# Patient Record
Sex: Female | Born: 2000 | Race: Black or African American | Hispanic: No | Marital: Single | Smoking: Never smoker
Health system: Southern US, Community
[De-identification: ages and names within clinical notes are randomized; demographics above are authoritative.]

## PROBLEM LIST (undated history)

## (undated) DIAGNOSIS — N920 Excessive and frequent menstruation with regular cycle: Secondary | ICD-10-CM

## (undated) DIAGNOSIS — D500 Iron deficiency anemia secondary to blood loss (chronic): Secondary | ICD-10-CM

## (undated) DIAGNOSIS — T7840XA Allergy, unspecified, initial encounter: Secondary | ICD-10-CM

## (undated) HISTORY — PX: WISDOM TOOTH EXTRACTION: SHX21

## (undated) HISTORY — DX: Allergy, unspecified, initial encounter: T78.40XA

---
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
## 2022-12-14 DIAGNOSIS — Z8619 Personal history of other infectious and parasitic diseases: Secondary | ICD-10-CM

## 2022-12-14 HISTORY — DX: Personal history of other infectious and parasitic diseases: Z86.19

## 2023-01-04 NOTE — Patient Instructions (Signed)
Welcome to [facility], It was a pleasure meeting you today!    I will review your lab results via MyChart in a few days.  Ok to take an over the counter iron supplement during your menstrual cycles to keep your iron level up. But if you are really low today, I may prescribed a RX strength.  See the attached info regarding birth control options like we talked about and if you decide you want to start something, schedule a visit or if IUD or Nexplanon I will need to refer you.     PLEASE NOTE: If you had any LAB tests please let us know if you have not heard back within a few days. You may see your results on MyChart before we have a chance to review them but we will give you a call once they are reviewed by [place]. If we ordered any REFERRALS today, please let us know if you have not heard from their office within the next week.  Let us know through MyChart if you are needing REFILLS, or have your pharmacy send [place] the request. You can also use MyChart to communicate with me or any office staff.  Please try these tips to maintain a healthy lifestyle: It is important that you exercise regularly at least 30 minutes 5 times a week. Think about what you will eat, plan ahead. Choose whole foods, & think  "clean, green, fresh or frozen" over canned, processed or packaged foods which are more sugary, salty, and fatty. 70 to 75% of food eaten should be fresh vegetables and protein. 2-3  meals daily with healthy snacks between meals, but must be whole fruit, protein or vegetables. Aim to eat over a 10 hour period when you are active, for example, 7am to 5pm, and then STOP after your last meal of the day, drinking only water.  Shorter eating windows, 6-8 hours, are showing benefits in heart disease and blood sugar regulation. Drink water every day! Shoot for 64 ounces daily = 8 cups, no other drink is as healthy! Fruit juice is best enjoyed in a healthy way, by EATING the  fruit.

## 2023-01-04 NOTE — Progress Notes (Unsigned)
Phone [phone]  Subjective:   Patient is a 22 y.o. female presenting for annual physical.    No chief complaint on file.   See problem oriented charting- ROS- full  review of systems was completed and negative except for *** noted in HPI above.  The following were reviewed and entered/updated in epic: No past medical history on file. There are no problems to display for this patient.  No past surgical history on file.  No family history on file.  Medications- reviewed and updated Current Outpatient Medications  Medication Sig Dispense Refill   fluticasone (FLONASE) 50 MCG/ACT nasal spray Place 1 spray into both nostrils daily. 9.9 g 0   methocarbamol (ROBAXIN) 500 MG tablet Take 1 tablet (500 mg total) by mouth every 6 (six) hours as needed for muscle spasms. 20 tablet 0   naproxen (NAPROSYN) 500 MG tablet Take 1 tablet (500 mg total) by mouth 2 (two) times daily. 20 tablet 0   pseudoephedrine (SUDAFED 12 HOUR) 120 MG 12 hr tablet Take 1 tablet (120 mg total) by mouth every 12 (twelve) hours as needed for congestion. 10 tablet 0   No current facility-administered medications for this visit.    Allergies-reviewed and updated No Known Allergies  Social History   Social History Narrative   Not on file    Objective:  There were no vitals taken for this visit. Physical Exam Vitals and nursing note reviewed.  Constitutional:      Appearance: Normal appearance.  HENT:     Head: Normocephalic.     Right Ear: Tympanic membrane normal.     Left Ear: Tympanic membrane normal.     Nose: Nose normal.     Mouth/Throat:     Mouth: Mucous membranes are moist.  Eyes:     Pupils: Pupils are equal, round, and reactive to light.  Cardiovascular:     Rate and Rhythm: Normal rate and regular rhythm.  Pulmonary:     Effort: Pulmonary effort is normal.     Breath sounds: Normal breath sounds.  Musculoskeletal:        General: Normal range of motion.     Cervical back:  Normal range of motion.  Lymphadenopathy:     Cervical: No cervical adenopathy.  Skin:    General: Skin is warm and dry.  Neurological:     Mental Status: She is alert.  Psychiatric:        Mood and Affect: Mood normal.        Behavior: Behavior normal.       Assessment and Plan   Health Maintenance counseling: 1. Anticipatory guidance: Patient counseled regarding regular dental exams q6 months, eye exams,  avoiding smoking and second hand smoke, limiting alcohol to 1 beverage per day, no illicit drugs.   2. Risk factor reduction:  Advised patient of need for regular exercise and diet rich with fruits and vegetables to reduce risk of heart attack and stroke. Wt Readings from Last 3 Encounters:  08/19/19 117 lb (53.1 kg) (31%, Z= -0.50)*  03/29/17 121 lb 0.5 oz (54.9 kg) (51%, Z= 0.03)*   * Growth percentiles are based on CDC (Girls, 2-20 Years) data.   3. Immunizations/screenings/ancillary studies  There is no immunization history on file for this patient. Health Maintenance Due  Topic Date Due   HPV VACCINES (1 - 3-dose series) Never done   HIV Screening  Never done   Hepatitis C Screening  Never done   DTaP/Tdap/Td (1 - Tdap) Never done  Cervical Cancer Screening (Pap smear)  Never done   INFLUENZA VACCINE  Never done   COVID-19 Vaccine (1 - 2023-24 season) Never done    4. Cervical cancer screening: *** 5. Skin cancer screening- advised regular sunscreen use. Denies worrisome, changing, or new skin lesions.  6. Birth control/STD check: *** 7. Smoking associated screening: *** smoker 8. Alcohol screening: ***  There are no diagnoses linked to this encounter.  Recommended follow up: ***No follow-ups on file. Future Appointments  Date Time Provider Department Center  01/05/2023  2:00 PM Dulce Sellar, NP [facility]    Lab/Order associations:fasting    Dulce Sellar, NP

## 2023-01-05 ENCOUNTER — Other Ambulatory Visit (HOSPITAL_COMMUNITY)
Admission: RE | Admit: 2023-01-05 | Discharge: 2023-01-05 | Disposition: A | Discharge status: Home / Self Care | Payer: Medicaid Other | Source: Ambulatory Visit | Attending: Family | Admitting: Family

## 2023-01-05 ENCOUNTER — Encounter: Payer: Self-pay | Admitting: Family

## 2023-01-05 ENCOUNTER — Ambulatory Visit (INDEPENDENT_AMBULATORY_CARE_PROVIDER_SITE_OTHER): Payer: Medicaid Other | Admitting: Family

## 2023-01-05 VITALS — BP 104/60 | HR 67 | Temp 97.8°F | Resp 16 | Ht 61.0 in | Wt 126.2 lb

## 2023-01-05 DIAGNOSIS — N920 Excessive and frequent menstruation with regular cycle: Secondary | ICD-10-CM | POA: Diagnosis not present

## 2023-01-05 DIAGNOSIS — A749 Chlamydial infection, unspecified: Secondary | ICD-10-CM | POA: Diagnosis not present

## 2023-01-05 DIAGNOSIS — Z113 Encounter for screening for infections with a predominantly sexual mode of transmission: Secondary | ICD-10-CM

## 2023-01-05 DIAGNOSIS — Z1159 Encounter for screening for other viral diseases: Secondary | ICD-10-CM

## 2023-01-05 DIAGNOSIS — D500 Iron deficiency anemia secondary to blood loss (chronic): Secondary | ICD-10-CM

## 2023-01-05 NOTE — Progress Notes (Signed)
New Patient Office Visit  Subjective:  Patient ID: Kayla Haley, female    DOB: September 12, 2000  Age: 22 y.o. MRN: [id number]  CC:  Chief Complaint  Patient presents with   Establish Care    Initial visit to establish care with new pcp     HPI Kayla Haley presents for establishing care today.  *Discussed the use of AI scribe software for clinical note transcription with the patient, who gave verbal consent to proceed.  Kayla Haley, a recent college graduate, presents to establish care. 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 She has applied for a [employer] program in psychology with the goal of becoming a therapist. She is currently living with family and is in a relationship with a female partner. She is sexually active and uses condoms for birth control and STD prevention. She has never been on hormonal birth control and keeps track of her menstrual cycle. She has had a past positive test for chlamydia, which was treated and resolved. Her menstrual cycles are regular but are associated with heavy bleeding and significant pain. She also reports a habit of eating ice frequently, but no increased fatigue.      Assessment & Plan:     STD testing  - Sexually active with a female partner. Not currently using hormonal birth control, relying on condoms and cycle tracking. Previous history of chlamydia. Discussed the importance of consistent condom use and the benefits of various forms of hormonal birth control. -Perform STD testing today. -Discussed potential benefits of hormonal birth control, including for heavy menstrual cycles.  Menstrual Cycle - Reports heavy and painful periods. Possible anemia due to heavy menstrual bleeding, suggested by reported ice craving. -Check CBC w/diff today. -Discussed potential benefits of hormonal birth control for heavy & painful menstrual cycles. Handouts also provided reinforcing teaching. Pt to let me know if interested in starting  something. -Recommended over-the-counter iron supplementation during menstrual cycle.  General Health Maintenance -Plan for a full physical at a future visit to check cholesterol and other health parameters. -Follow-up on STD and anemia testing results via MyChart by the end of the week.      Subjective:    Outpatient Medications Prior to Visit  Medication Sig Dispense Refill   fluticasone (FLONASE) 50 MCG/ACT nasal spray Place 1 spray into both nostrils daily. (Patient not taking: Reported on 01/05/2023) 9.9 g 0   methocarbamol (ROBAXIN) 500 MG tablet Take 1 tablet (500 mg total) by mouth every 6 (six) hours as needed for muscle spasms. (Patient not taking: Reported on 01/05/2023) 20 tablet 0   naproxen (NAPROSYN) 500 MG tablet Take 1 tablet (500 mg total) by mouth 2 (two) times daily. (Patient not taking: Reported on 01/05/2023) 20 tablet 0   pseudoephedrine (SUDAFED 12 HOUR) 120 MG 12 hr tablet Take 1 tablet (120 mg total) by mouth every 12 (twelve) hours as needed for congestion. (Patient not taking: Reported on 01/05/2023) 10 tablet 0   No facility-administered medications prior to visit.   Past Medical History:  Diagnosis Date   Allergy    Past Surgical History:  Procedure Laterality Date   WISDOM TOOTH EXTRACTION Bilateral     Objective:   Today's Vitals: BP 104/60   Pulse 67   Temp 97.8 F (36.6 C) (Temporal)   Resp 16   Ht 5\' 1"  (1.549 m)   Wt 126 lb 4 oz (57.3 kg)   LMP 01/05/2023 (Exact Date)   SpO2 99%   BMI 23.85 kg/m  Physical Exam Vitals and nursing note reviewed.  Constitutional:      Appearance: Normal appearance.  Cardiovascular:     Rate and Rhythm: Normal rate and regular rhythm.  Pulmonary:     Effort: Pulmonary effort is normal.     Breath sounds: Normal breath sounds.  Musculoskeletal:        General: Normal range of motion.  Skin:    General: Skin is warm and dry.  Neurological:     Mental Status: She is alert.  Psychiatric:        Mood  and Affect: Mood normal.        Behavior: Behavior normal.     Dulce Sellar, NP

## 2023-01-06 ENCOUNTER — Telehealth: Payer: Self-pay

## 2023-01-06 LAB — CBC WITH DIFFERENTIAL/PLATELET
Basophils Absolute: 0.1 10*3/uL (ref 0.0–0.1)
Basophils Relative: 0.9 % (ref 0.0–3.0)
Eosinophils Absolute: 0 10*3/uL (ref 0.0–0.7)
Eosinophils Relative: 0.5 % (ref 0.0–5.0)
HCT: 33.1 % — ABNORMAL LOW (ref 36.0–46.0)
Hemoglobin: 9.9 g/dL — ABNORMAL LOW (ref 12.0–15.0)
Lymphocytes Relative: 28 % (ref 12.0–46.0)
Lymphs Abs: 2 10*3/uL (ref 0.7–4.0)
MCHC: 29.9 g/dL — ABNORMAL LOW (ref 30.0–36.0)
MCV: 77.9 fl — ABNORMAL LOW (ref 78.0–100.0)
Monocytes Absolute: 0.5 10*3/uL (ref 0.1–1.0)
Monocytes Relative: 6.5 % (ref 3.0–12.0)
Neutro Abs: 4.7 10*3/uL (ref 1.4–7.7)
Neutrophils Relative %: 64.1 % (ref 43.0–77.0)
Platelets: 268 10*3/uL (ref 150.0–400.0)
RBC: 4.25 Mil/uL (ref 3.87–5.11)
RDW: 17.8 % — ABNORMAL HIGH (ref 11.5–15.5)
WBC: 7.3 10*3/uL (ref 4.0–10.5)

## 2023-01-06 LAB — HIV ANTIBODY (ROUTINE TESTING W REFLEX): HIV 1&2 Ab, 4th Generation: NONREACTIVE

## 2023-01-06 LAB — HEPATITIS C ANTIBODY: Hepatitis C Ab: NONREACTIVE

## 2023-01-06 NOTE — Telephone Encounter (Signed)
REQUESTED

## 2023-01-07 ENCOUNTER — Encounter: Payer: Self-pay | Admitting: Family

## 2023-01-07 LAB — URINE CYTOLOGY ANCILLARY ONLY
Chlamydia: POSITIVE — AB
Comment: NEGATIVE
Comment: NORMAL
Neisseria Gonorrhea: NEGATIVE

## 2023-01-08 ENCOUNTER — Telehealth: Payer: Self-pay | Admitting: Family

## 2023-01-08 MED ORDER — FERROUS SULFATE 325 (65 FE) MG PO TBEC: 325.0000 mg | DELAYED_RELEASE_TABLET | Freq: Every day | ORAL | 1 refills | Status: DC

## 2023-01-08 MED ORDER — DOXYCYCLINE HYCLATE 100 MG PO TABS: 100.0000 mg | ORAL_TABLET | Freq: Two times a day (BID) | ORAL | 0 refills | Status: DC

## 2023-01-08 NOTE — Telephone Encounter (Signed)
Patient requests to be called to discuss Lab results received on MyChart 

## 2023-01-08 NOTE — Addendum Note (Signed)
Addended byDulce Sellar on: 01/08/2023 11:32 PM   Modules accepted: Orders

## 2023-01-09 NOTE — Telephone Encounter (Signed)
I returned pt's call in regards to labs, pt verbalized understanding.   Reported to [employer] department of Diseases.

## 2023-02-25 ENCOUNTER — Encounter: Payer: Self-pay | Admitting: Family

## 2023-02-28 ENCOUNTER — Other Ambulatory Visit: Payer: Self-pay

## 2023-02-28 ENCOUNTER — Emergency Department (HOSPITAL_BASED_OUTPATIENT_CLINIC_OR_DEPARTMENT_OTHER): Payer: Self-pay

## 2023-02-28 ENCOUNTER — Emergency Department (HOSPITAL_BASED_OUTPATIENT_CLINIC_OR_DEPARTMENT_OTHER)
Admission: EM | Admit: 2023-02-28 | Discharge: 2023-02-28 | Disposition: A | Discharge status: Home / Self Care | Payer: Self-pay | Attending: Emergency Medicine | Admitting: Emergency Medicine

## 2023-02-28 ENCOUNTER — Encounter (HOSPITAL_BASED_OUTPATIENT_CLINIC_OR_DEPARTMENT_OTHER): Payer: Self-pay

## 2023-02-28 DIAGNOSIS — Y9302 Activity, running: Secondary | ICD-10-CM | POA: Insufficient documentation

## 2023-02-28 DIAGNOSIS — S82842A Displaced bimalleolar fracture of left lower leg, initial encounter for closed fracture: Secondary | ICD-10-CM | POA: Insufficient documentation

## 2023-02-28 DIAGNOSIS — X500XXA Overexertion from strenuous movement or load, initial encounter: Secondary | ICD-10-CM | POA: Insufficient documentation

## 2023-02-28 DIAGNOSIS — S82892A Other fracture of left lower leg, initial encounter for closed fracture: Secondary | ICD-10-CM

## 2023-02-28 HISTORY — DX: Other fracture of left lower leg, initial encounter for closed fracture: S82.892A

## 2023-02-28 MED ORDER — ONDANSETRON HCL 4 MG/2ML IJ SOLN
4.0000 mg | Freq: Once | INTRAMUSCULAR | Status: AC
  Administered 2023-02-28: 4 mg via INTRAVENOUS
  Filled 2023-02-28: qty 2

## 2023-02-28 MED ORDER — PROPOFOL 10 MG/ML IV BOLUS
INTRAVENOUS | Status: AC | PRN
  Administered 2023-02-28 (×2): 20 mg via INTRAVENOUS
  Administered 2023-02-28: 28.5 mg via INTRAVENOUS
  Administered 2023-02-28: 20 mg via INTRAVENOUS

## 2023-02-28 MED ORDER — PROPOFOL 10 MG/ML IV BOLUS
0.5000 mg/kg | Freq: Once | INTRAVENOUS | Status: DC
  Filled 2023-02-28: qty 20

## 2023-02-28 MED ORDER — KETAMINE HCL 10 MG/ML IJ SOLN
INTRAMUSCULAR | Status: AC | PRN
  Administered 2023-02-28: 29 mg via INTRAVENOUS

## 2023-02-28 MED ORDER — KETOROLAC TROMETHAMINE 30 MG/ML IJ SOLN
30.0000 mg | Freq: Once | INTRAMUSCULAR | Status: AC
  Administered 2023-02-28: 30 mg via INTRAVENOUS
  Filled 2023-02-28: qty 1

## 2023-02-28 MED ORDER — SODIUM CHLORIDE 0.9 % IV SOLN: Freq: Once | INTRAVENOUS | Status: AC

## 2023-02-28 MED ORDER — SODIUM CHLORIDE 0.9 % IV SOLN: Freq: Once | INTRAVENOUS | Status: DC

## 2023-02-28 MED ORDER — OXYCODONE HCL 5 MG PO TABS: 2.5000 mg | ORAL_TABLET | ORAL | 0 refills | Status: DC | PRN

## 2023-02-28 MED ORDER — OXYCODONE-ACETAMINOPHEN 5-325 MG PO TABS
1.0000 | ORAL_TABLET | Freq: Once | ORAL | Status: AC
  Administered 2023-02-28: 1 via ORAL
  Filled 2023-02-28: qty 1

## 2023-02-28 MED ORDER — KETAMINE HCL 10 MG/ML IJ SOLN
0.5000 mg/kg | Freq: Once | INTRAMUSCULAR | Status: DC
  Filled 2023-02-28: qty 1

## 2023-02-28 MED ORDER — FENTANYL CITRATE PF 50 MCG/ML IJ SOSY
50.0000 ug | PREFILLED_SYRINGE | Freq: Once | INTRAMUSCULAR | Status: AC
  Administered 2023-02-28: 50 ug via INTRAVENOUS
  Filled 2023-02-28: qty 1

## 2023-02-28 MED ORDER — NAPROXEN 375 MG PO TABS: 375.0000 mg | ORAL_TABLET | Freq: Two times a day (BID) | ORAL | 0 refills | Status: DC

## 2023-02-28 NOTE — Discharge Instructions (Addendum)
You were treated in the emergency department for an ankle fracture.  Have a splint in place.  This will need to remain in place until you follow-up with the outpatient orthopedic specialist.  Please review the attached instructions sheet on splint care. Discharging you with medication for pain relief.  Do not bear weight on the ankle at all.   The splint is not a cast and will not stand up to pressure.  You could also worsen the condition of your broken ankle because the bones to move out of place. Use your crutches at all time.   Do not get your splint wet.  Take one 500 -650 mg Tylenol every 6 hours while awake along with the medications I have prescribed to reduce inflammation.  He may take the stronger pain medicine when it is absolutely necessary.  Contact a health care provider if: You have pain or swelling that gets worse. Your pain or swelling does not get better with rest or medicine. Your cast gets damaged. Get help right away if: You develop new pain or swelling. Your skin or toes below the injured ankle: Turn blue or gray. Feel cold. Lose feeling. Have less feeling than normal when something touches them.

## 2023-02-28 NOTE — ED Notes (Signed)
ED Provider at bedside. 

## 2023-02-28 NOTE — ED Provider Notes (Signed)
[facility] Provider Note   CSN: [id number] Arrival date & time: 02/28/23  1003     History  Chief Complaint  Patient presents with   Ankle Pain    Kayla Haley is a 22 y.o. female who presents to the emergency department for evaluation of left ankle injury.  Patient states that around 12 PM last night she was running.  She was intoxicated.  She was wearing flat boots.  She is unsure of the mechanism of injury however states that she thinks she might of rolled her ankle.  She heard a crack.  She had immediate severe pain and inability to bear weight on the ankle.  By time she got home her ankle is quite swollen.  She was unable to remove her jeans.  She presents today with significant swelling and pain in the left ankle.  No numbness tingling or other discoloration.   Ankle Pain      Home Medications Prior to Admission medications   Medication Sig Start Date End Date Taking? Authorizing Provider  naproxen (NAPROSYN) 375 MG tablet Take 1 tablet (375 mg total) by mouth 2 (two) times daily with a meal. 02/28/23  Yes Anayelli Lai, PA-C  oxyCODONE (ROXICODONE) 5 MG immediate release tablet Take 0.5-1 tablets (2.5-5 mg total) by mouth every 4 (four) hours as needed for severe pain (pain score 7-10). 02/28/23  Yes Maliaka Brasington, PA-C  doxycycline (VIBRA-TABS) 100 MG tablet Take 1 tablet (100 mg total) by mouth 2 (two) times daily. 01/08/23   Dulce Sellar, NP  ferrous sulfate 325 (65 FE) MG EC tablet Take 1 tablet (325 mg total) by mouth daily with breakfast. 01/08/23   Dulce Sellar, NP      Allergies    Patient has no known allergies.    Review of Systems   Review of Systems  Physical Exam Updated Vital Signs BP 96/61   Pulse 77   Temp 98.2 F (36.8 C)   Resp (!) 21   Ht 5\' 1"  (1.549 m)   Wt 57 kg   LMP 02/23/2023 (Exact Date)   SpO2 100%   BMI 23.74 kg/m  Physical Exam Vitals and nursing note reviewed.   Constitutional:      General: She is not in acute distress.    Appearance: She is well-developed. She is not diaphoretic.  HENT:     Head: Normocephalic and atraumatic.     Right Ear: External ear normal.     Left Ear: External ear normal.     Nose: Nose normal.     Mouth/Throat:     Mouth: Mucous membranes are moist.  Eyes:     General: No scleral icterus.    Conjunctiva/sclera: Conjunctivae normal.  Cardiovascular:     Rate and Rhythm: Normal rate and regular rhythm.     Heart sounds: Normal heart sounds. No murmur heard.    No friction rub. No gallop.  Pulmonary:     Effort: Pulmonary effort is normal. No respiratory distress.     Breath sounds: Normal breath sounds.  Abdominal:     General: Bowel sounds are normal. There is no distension.     Palpations: Abdomen is soft. There is no mass.     Tenderness: There is no abdominal tenderness. There is no guarding.  Musculoskeletal:     Cervical back: Normal range of motion.     Comments: Left ankle with obvious deformity, bilateral malleolar swelling and exquisite tenderness to palpation.  No calf or knee pain to palpation.  DP/PT pulse 2+.  Wiggles toes, cap refill less than 2 seconds.  Skin:    General: Skin is warm and dry.  Neurological:     Mental Status: She is alert and oriented to person, place, and time.  Psychiatric:        Behavior: Behavior normal.     ED Results / Procedures / Treatments   Labs (all labs ordered are listed, but only abnormal results are displayed) Labs Reviewed - No data to display  EKG None  Radiology DG Ankle Complete Left  Result Date: 02/28/2023 CLINICAL DATA:  Twisted left ankle. EXAM: LEFT ANKLE COMPLETE - 3+ VIEW COMPARISON:  None Available. FINDINGS: Oblique fracture is present the distal fibula. An avulsion fracture is present at the medial malleolus. Widening of the tibia-fibular distance is present. An oblique subtalar fracture is present. Extensive soft tissue swelling is  present. IMPRESSION: 1. Oblique fracture of the distal fibula. 2. Avulsion fracture of the medial malleolus. Mechanism consistent with eversion injury. 3. Oblique subtalar fracture. Electronically Signed   By: Marin Roberts M.D.   On: 02/28/2023 11:00    Procedures Reduction of fracture  Date/Time: 02/28/2023 12:24 PM  Performed by: Arthor Captain, PA-C Authorized by: Arthor Captain, PA-C  Risks and benefits: risks, benefits and alternatives were discussed Patient identity confirmed: hospital-assigned identification number Preparation: Patient was prepped and draped in the usual sterile fashion. Local anesthesia used: no  Anesthesia: Local anesthesia used: no  Sedation: Patient sedated: yes (see note by Dr. Elayne Snare) Vitals: Vital signs were monitored during sedation.  Patient tolerance: patient tolerated the procedure well with no immediate complications   .Splint Application  Date/Time: 02/28/2023 12:25 PM  Performed by: Arthor Captain, PA-C Authorized by: Arthor Captain, PA-C   Consent:    Consent obtained:  Verbal   Consent given by:  Patient   Risks discussed:  Numbness, discoloration and pain   Alternatives discussed:  No treatment Universal protocol:    Patient identity confirmed:  Arm band Pre-procedure details:    Distal neurologic exam:  Normal   Distal perfusion: distal pulses strong and brisk capillary refill   Procedure details:    Location:  Leg   Leg location:  L lower leg   Splint type:  Short leg (post/stirrup)   Attestation: Splint applied and adjusted personally by me   Post-procedure details:    Distal neurologic exam:  Normal   Distal perfusion: distal pulses strong     Procedure completion:  Tolerated well, no immediate complications     Medications Ordered in ED Medications  ketamine (KETALAR) injection 29 mg (10 mg Intravenous See Procedure Record 02/28/23 1233)  propofol (DIPRIVAN) 10 mg/mL bolus/IV push 28.5 mg (28.5 mg  Intravenous See Procedure Record 02/28/23 1234)  0.9 %  sodium chloride infusion (0 mLs Intravenous Stopped 02/28/23 1250)  ketamine (KETALAR) injection (29 mg Intravenous Given 02/28/23 1206)  propofol (DIPRIVAN) 10 mg/mL bolus/IV push (20 mg Intravenous Given 02/28/23 1211)  fentaNYL (SUBLIMAZE) injection 50 mcg (50 mcg Intravenous Given 02/28/23 1225)  ondansetron (ZOFRAN) injection 4 mg (4 mg Intravenous Given 02/28/23 1224)  ketorolac (TORADOL) 30 MG/ML injection 30 mg (30 mg Intravenous Given 02/28/23 1346)  oxyCODONE-acetaminophen (PERCOCET/ROXICET) 5-325 MG per tablet 1 tablet (1 tablet Oral Given 02/28/23 1346)    ED Course/ Medical Decision Making/ A&P Clinical Course as of 02/28/23 1352  Sat Feb 28, 2023  1110 Case discussed with Dr. Odis Hollingshead who agrees with plan for  fracture reduction posterior and stirrup and.  She should remain nonweightbearing.  He does ask for follow-up CT of the ankle and she may follow-up in clinic with him this week. [AH]  1342 DG Ankle Complete Left [AH]  1342 CT Ankle Left Wo Contrast I visualized and interpreted both prereduction left ankle plain film which showed bimalleolar fracture. I visualized and interpreted CT of the ankle.  Patient appears to have improved alignment of the ankle mortise. [AH]    Clinical Course User Index [AH] Arthor Captain, PA-C                                 Medical Decision Making Patient here with ankle injury.  She has a left by mall fracture.  Patient fracture reduced and splint placed.  She is having significant pain likely due to manipulation of the ankle.  Will discharge with anti-inflammatories and a very short course of pain medication.  Patient will be encouraged to elevate and ice the ankle is much as possible.  Patient given outpatient follow-up and return precautions.  Amount and/or Complexity of Data Reviewed Radiology: ordered.  Risk Prescription drug management.   PDMP reviewed during this  encounter.         Final Clinical Impression(s) / ED Diagnoses Final diagnoses:  Closed bimalleolar fracture of left ankle, initial encounter    Rx / DC Orders ED Discharge Orders          Ordered    naproxen (NAPROSYN) 375 MG tablet  2 times daily with meals        02/28/23 1352    oxyCODONE (ROXICODONE) 5 MG immediate release tablet  Every 4 hours PRN        02/28/23 1352              Arthor Captain, PA-C 02/28/23 1352    Royanne Foots, DO 03/02/23 [phone]

## 2023-02-28 NOTE — ED Notes (Signed)
RT at bedside for sedation procedure. AMBU, ETCO2 and suction set up. ETCO2 30. Patient preoxygenated with 2L. No issues. Patient awake and alert at this time

## 2023-02-28 NOTE — ED Triage Notes (Signed)
The patient was running and twisted her left ankle.

## 2023-02-28 NOTE — ED Notes (Signed)
Patient transported to X-ray 

## 2023-03-01 NOTE — ED Provider Notes (Signed)
.  Sedation  Date/Time: 02/28/2023 11:45 AM  Performed by: Royanne Foots, DO Authorized by: Royanne Foots, DO   Consent:    Consent obtained:  Written and verbal   Consent given by:  Patient   Risks discussed:  Allergic reaction, dysrhythmia, prolonged hypoxia resulting in organ damage, prolonged sedation necessitating reversal, nausea, inadequate sedation, vomiting and respiratory compromise necessitating ventilatory assistance and intubation   Alternatives discussed:  Analgesia without sedation Universal protocol:    Procedure explained and questions answered to patient or proxy's satisfaction: yes     Imaging studies available: yes     Immediately prior to procedure, a time out was called: yes     Patient identity confirmed:  Verbally with patient and provided demographic data Indications:    Procedure performed:  Fracture reduction   Procedure necessitating sedation performed by:  Physician performing sedation Pre-sedation assessment:    Time since last food or drink:  0700   NPO status caution: urgency dictates proceeding with non-ideal NPO status     ASA classification: class 1 - normal, healthy patient     Mouth opening:  3 or more finger widths   Thyromental distance:  4 finger widths   Mallampati score:  I - soft palate, uvula, fauces, pillars visible   Neck mobility: normal     Pre-sedation assessments completed and reviewed: pre-procedure airway patency not reviewed, pre-procedure cardiovascular function not reviewed, pre-procedure mental status not reviewed, pre-procedure nausea and vomiting status not reviewed, pre-procedure pain level not reviewed and pre-procedure respiratory function not reviewed     Pre-sedation assessment completed:  02/28/2023 11:55 AM Immediate pre-procedure details:    Reassessment: Patient reassessed immediately prior to procedure     Reviewed: vital signs     Verified: bag valve mask available, emergency equipment available, intubation  equipment available, IV patency confirmed, oxygen available and suction available   Procedure details (see MAR for exact dosages):    Preoxygenation:  Nasal cannula   Sedation:  Ketamine and propofol   Intended level of sedation: deep   Analgesia:  Fentanyl   Intra-procedure monitoring:  Blood pressure monitoring, continuous pulse oximetry, continuous capnometry, cardiac monitor and frequent vital sign checks   Intra-procedure events: none     Intra-procedure management:  Supplemental oxygen   Total Provider sedation time (minutes):  33 Post-procedure details:    Post-sedation assessment completed:  02/28/2023 12:32 PM   Attendance: Constant attendance by certified staff until patient recovered     Recovery: Patient returned to pre-procedure baseline     Post-sedation assessments completed and reviewed: post-procedure airway patency not reviewed, post-procedure cardiovascular function not reviewed, post-procedure mental status not reviewed, pain score not reviewed and post-procedure respiratory function not reviewed     Patient is stable for discharge or admission: yes     Procedure completion:  Tolerated well, no immediate complications     Royanne Foots, DO 03/01/23 [id number]

## 2023-03-19 ENCOUNTER — Encounter (HOSPITAL_BASED_OUTPATIENT_CLINIC_OR_DEPARTMENT_OTHER): Payer: Self-pay | Admitting: Orthopaedic Surgery

## 2023-03-19 NOTE — Progress Notes (Signed)
Spoke w/ via phone for pre-op interview--- pt Lab needs dos----  urine preg       Lab results------ no COVID test -----patient states asymptomatic no test needed Arrive at -------  0630 on 03-23-2023 NPO after MN NO Solid Food.  Clear liquids from MN until--- 0530 Med rec completed Medications to take morning of surgery ----- none Diabetic medication ----- n/a Patient instructed no nail polish to be worn day of surgery Patient instructed to bring photo id and insurance card day of surgery Patient aware to have Driver (ride ) / caregiver    for 24 hours after surgery - mother, sherlisa Patient Special Instructions ----- n/a Pre-Op special Instructions ----- case just added on today,  pre-op orders pending Patient verbalized understanding of instructions that were given at this phone interview. Patient denies chest pain, sob, fever, cough at the interview.

## 2023-03-22 NOTE — Discharge Instructions (Signed)
Kayla Ramanathan, MD [facility]  Please read the following information regarding your care after surgery.  Medications  You only need a prescription for the narcotic pain medicine (ex. oxycodone, Percocet, Norco).  All of the other medicines listed below are available over the counter. ? Aleve 2 pills twice a day for the first 3 days after surgery. ? acetominophen (Tylenol) 650 mg every 4-6 hours as you need for minor to moderate pain ? oxycodone as prescribed for severe pain  ? To help prevent blood clots, take aspirin (81 mg) twice daily for 42 days after surgery (or total duration of nonweightbearing).  You should also get up every hour while you are awake to move around.  Weight Bearing ? Do NOT bear any weight on the operated leg or foot. This means do NOT touch your surgical leg to the ground!  Cast / Splint / Dressing ? If you have a splint, do NOT remove this. Keep your splint, cast or dressing clean and dry.  Don't put anything (coat hanger, pencil, etc) down inside of it.  If it gets wet, call the office immediately to schedule an appointment for a cast change.  Swelling IMPORTANT: It is normal for you to have swelling where you had surgery. To reduce swelling and pain, keep at least 3 pillows under your leg so that your toes are above your nose and your heel is above the level of your hip.  It may be necessary to keep your foot or leg elevated for several weeks.  This is critical to helping your incisions heal and your pain to feel better.  Follow Up Call my office at [phone] when you are discharged from the hospital or surgery center to schedule an appointment to be seen 7-10 days after surgery.  Call my office at [phone] if you develop a fever >101.5 F, nausea, vomiting, bleeding from the surgical site or severe pain.     

## 2023-03-22 NOTE — H&P (Signed)
ORTHOPAEDIC SURGERY H&P  Subjective:  The patient presents with left ankle fx.   Past Medical History:  Diagnosis Date   Closed left ankle fracture 02/28/2023   ED visit in epic;  s/p closed reduction w/ sedation   History of chlamydia 12/2022   treated   Iron deficiency anemia due to chronic blood loss    Menorrhagia with regular cycle     Past Surgical History:  Procedure Laterality Date   TONSILLECTOMY AND ADENOIDECTOMY  12/13/2003   @ [facility] by dr j. byers   WISDOM TOOTH EXTRACTION Bilateral 2020     (Not in an outpatient encounter)    No Known Allergies  Social History   Socioeconomic History   Marital status: Single    Spouse name: Not on file   Number of children: Not on file   Years of education: Not on file   Highest education level: Not on file  Occupational History   Not on file  Tobacco Use   Smoking status: Never   Smokeless tobacco: Never  Vaping Use   Vaping status: Every Day   Substances: Nicotine, Flavoring   Devices: geek bar  Substance and Sexual Activity   Alcohol use: Not Currently    Comment: 03-19-2023 pt stated average 2 every other week   Drug use: Never   Sexual activity: Yes    Birth control/protection: None  Other Topics Concern   Not on file  Social History Narrative   Not on file   Social Determinants of Health   Financial Resource Strain: Not on file  Food Insecurity: Not on file  Transportation Needs: Not on file  Physical Activity: Not on file  Stress: Not on file  Social Connections: Not on file  Intimate Partner Violence: Not on file     History reviewed. No pertinent family history.   Review of Systems Pertinent items are noted in HPI.  Objective: Vital signs in last 24 hours:    03/19/2023    4:52 PM 02/28/2023    1:30 PM 02/28/2023    1:15 PM  Vitals with BMI  Height 5\' 1"     Weight 128 lbs    BMI 24.2    Systolic  96 110  Diastolic  61 70  Pulse  77 78      EXAM: General: Well nourished,  well developed. Awake, alert and oriented to time, place, person. Normal mood and affect. No apparent distress. Breathing room air.  Operative Lower Extremity: Alignment - Neutral Deformity - None Skin intact Tenderness to palpation - left ankle 5/5 TA, PT, GS, Per, EHL, FHL Sensation intact to light touch throughout Palpable DP and PT pulses Special testing: None  The contralateral foot/ankle was examined for comparison and noted to be neurovascularly intact with no localized deformity, swelling, or tenderness.  Imaging Review All images taken were independently reviewed by me.  Assessment/Plan: The clinical and radiographic findings were reviewed and discussed at length with the patient.  The patient has left ankle fx.  We spoke at length about the natural course of these findings. We discussed nonoperative and operative treatment options in detail.  The risks and benefits were presented and reviewed. The risks due to hardware failure/irritation, new/persistent/recurrent infection, stiffness, nerve/vessel/tendon injury, nonunion/malunion of any fracture, wound healing issues, allograft usage, development of arthritis, failure of this surgery, possibility of external fixation in certain situations, possibility of delayed definitive surgery, need for further surgery, prolonged wound care including further soft tissue coverage procedures, thromboembolic events, anesthesia/medical  complications/events perioperatively and beyond, amputation, death among others were discussed. The patient acknowledged the explanation and agreed to proceed with the plan.  Netta Cedars  Orthopaedic Surgery [facility]

## 2023-03-23 ENCOUNTER — Ambulatory Visit (HOSPITAL_COMMUNITY): Payer: Medicaid Other

## 2023-03-23 ENCOUNTER — Other Ambulatory Visit: Payer: Self-pay

## 2023-03-23 ENCOUNTER — Ambulatory Visit (HOSPITAL_BASED_OUTPATIENT_CLINIC_OR_DEPARTMENT_OTHER): Payer: Medicaid Other | Admitting: Anesthesiology

## 2023-03-23 ENCOUNTER — Encounter (HOSPITAL_BASED_OUTPATIENT_CLINIC_OR_DEPARTMENT_OTHER): Payer: Self-pay | Admitting: Orthopaedic Surgery

## 2023-03-23 ENCOUNTER — Encounter (HOSPITAL_BASED_OUTPATIENT_CLINIC_OR_DEPARTMENT_OTHER)
Admission: RE | Disposition: A | Discharge status: Home / Self Care | Payer: Self-pay | Source: Home / Self Care | Attending: Orthopaedic Surgery

## 2023-03-23 ENCOUNTER — Ambulatory Visit (HOSPITAL_BASED_OUTPATIENT_CLINIC_OR_DEPARTMENT_OTHER)
Admission: RE | Admit: 2023-03-23 | Discharge: 2023-03-23 | Disposition: A | Discharge status: Home / Self Care | Payer: Medicaid Other | Attending: Orthopaedic Surgery | Admitting: Orthopaedic Surgery

## 2023-03-23 DIAGNOSIS — D649 Anemia, unspecified: Secondary | ICD-10-CM | POA: Diagnosis not present

## 2023-03-23 DIAGNOSIS — S93432A Sprain of tibiofibular ligament of left ankle, initial encounter: Secondary | ICD-10-CM | POA: Diagnosis not present

## 2023-03-23 DIAGNOSIS — S82842A Displaced bimalleolar fracture of left lower leg, initial encounter for closed fracture: Secondary | ICD-10-CM

## 2023-03-23 DIAGNOSIS — F17290 Nicotine dependence, other tobacco product, uncomplicated: Secondary | ICD-10-CM | POA: Insufficient documentation

## 2023-03-23 DIAGNOSIS — X58XXXA Exposure to other specified factors, initial encounter: Secondary | ICD-10-CM | POA: Insufficient documentation

## 2023-03-23 DIAGNOSIS — Z01818 Encounter for other preprocedural examination: Secondary | ICD-10-CM

## 2023-03-23 HISTORY — PX: ORIF ANKLE FRACTURE: SHX5408

## 2023-03-23 HISTORY — DX: Excessive and frequent menstruation with regular cycle: N92.0

## 2023-03-23 HISTORY — PX: SYNDESMOSIS REPAIR: SHX5182

## 2023-03-23 HISTORY — DX: Iron deficiency anemia secondary to blood loss (chronic): D50.0

## 2023-03-23 LAB — POCT PREGNANCY, URINE: Preg Test, Ur: NEGATIVE

## 2023-03-23 LAB — SURGICAL PCR SCREEN
MRSA, PCR: NEGATIVE
Staphylococcus aureus: NEGATIVE

## 2023-03-23 SURGERY — OPEN REDUCTION INTERNAL FIXATION (ORIF) ANKLE FRACTURE
Anesthesia: General | Site: Ankle | Laterality: Left

## 2023-03-23 MED ORDER — CEFAZOLIN SODIUM-DEXTROSE 2-4 GM/100ML-% IV SOLN
2.0000 g | INTRAVENOUS | Status: AC
  Administered 2023-03-23: 2 g via INTRAVENOUS

## 2023-03-23 MED ORDER — MUPIROCIN 2 % EX OINT
1.0000 | TOPICAL_OINTMENT | Freq: Two times a day (BID) | CUTANEOUS | Status: DC
  Administered 2023-03-23: 1 via NASAL
  Filled 2023-03-23: qty 22

## 2023-03-23 MED ORDER — VANCOMYCIN HCL 500 MG IV SOLR
INTRAVENOUS | Status: DC | PRN
  Administered 2023-03-23: 500 mg via TOPICAL

## 2023-03-23 MED ORDER — MIDAZOLAM HCL 2 MG/2ML IJ SOLN
INTRAMUSCULAR | Status: AC
  Filled 2023-03-23: qty 2

## 2023-03-23 MED ORDER — STERILE WATER FOR IRRIGATION IR SOLN
Status: DC | PRN
  Administered 2023-03-23: 1000 mL

## 2023-03-23 MED ORDER — CEFAZOLIN SODIUM-DEXTROSE 2-4 GM/100ML-% IV SOLN
INTRAVENOUS | Status: AC
  Filled 2023-03-23: qty 100

## 2023-03-23 MED ORDER — OXYCODONE HCL 5 MG/5ML PO SOLN
5.0000 mg | Freq: Once | ORAL | Status: AC | PRN
Start: 2023-03-23 — End: 2023-03-23

## 2023-03-23 MED ORDER — PROPOFOL 10 MG/ML IV BOLUS
INTRAVENOUS | Status: AC
  Filled 2023-03-23: qty 20

## 2023-03-23 MED ORDER — ONDANSETRON HCL 4 MG/2ML IJ SOLN: 4.0000 mg | Freq: Once | INTRAMUSCULAR | Status: DC | PRN

## 2023-03-23 MED ORDER — HYDROMORPHONE HCL 1 MG/ML IJ SOLN
INTRAMUSCULAR | Status: AC
  Filled 2023-03-23: qty 1

## 2023-03-23 MED ORDER — SCOPOLAMINE 1 MG/3DAYS TD PT72SCOPOLAMINE 1 MG/3DAYS
1.0000 | MEDICATED_PATCH | TRANSDERMAL | Status: DC
Start: 2023-03-23 — End: 2023-03-23

## 2023-03-23 MED ORDER — FENTANYL CITRATE (PF) 100 MCG/2ML IJ SOLN
INTRAMUSCULAR | Status: AC
  Filled 2023-03-23: qty 2

## 2023-03-23 MED ORDER — FENTANYL CITRATE (PF) 100 MCG/2ML IJ SOLN
50.0000 ug | Freq: Once | INTRAMUSCULAR | Status: AC
  Administered 2023-03-23: 50 ug via INTRAVENOUS

## 2023-03-23 MED ORDER — FENTANYL CITRATE (PF) 250 MCG/5ML IJ SOLN
INTRAMUSCULAR | Status: DC | PRN
  Administered 2023-03-23: 50 ug via INTRAVENOUS

## 2023-03-23 MED ORDER — HYDROMORPHONE HCL 1 MG/ML IJ SOLN
0.2500 mg | INTRAMUSCULAR | Status: DC | PRN
Start: 2023-03-23 — End: 2023-03-23
  Administered 2023-03-23: 0.25 mg via INTRAVENOUS

## 2023-03-23 MED ORDER — MIDAZOLAM HCL 2 MG/2ML IJ SOLN
2.0000 mg | Freq: Once | INTRAMUSCULAR | Status: AC
  Administered 2023-03-23: 2 mg via INTRAVENOUS

## 2023-03-23 MED ORDER — BUPIVACAINE HCL (PF) 0.5 % IJ SOLN
INTRAMUSCULAR | Status: DC | PRN
  Administered 2023-03-23: 15 mL via PERINEURAL
  Administered 2023-03-23: 20 mL via PERINEURAL

## 2023-03-23 MED ORDER — PROPOFOL 10 MG/ML IV BOLUS
INTRAVENOUS | Status: DC | PRN
  Administered 2023-03-23: 160 mg via INTRAVENOUS

## 2023-03-23 MED ORDER — DEXAMETHASONE SODIUM PHOSPHATE 10 MG/ML IJ SOLN
INTRAMUSCULAR | Status: DC | PRN
  Administered 2023-03-23: 5 mg via INTRAVENOUS

## 2023-03-23 MED ORDER — BUPIVACAINE LIPOSOME 1.3 % IJ SUSP
INTRAMUSCULAR | Status: DC | PRN
  Administered 2023-03-23: 10 mL via PERINEURAL

## 2023-03-23 MED ORDER — CHLORHEXIDINE GLUCONATE 4 % EX SOLN
60.0000 mL | Freq: Once | CUTANEOUS | Status: DC
Start: 2023-03-23 — End: 2023-03-23

## 2023-03-23 MED ORDER — LACTATED RINGERS IV SOLN: INTRAVENOUS | Status: DC

## 2023-03-23 MED ORDER — OXYCODONE HCL 5 MG PO TABS
5.0000 mg | ORAL_TABLET | Freq: Once | ORAL | Status: AC | PRN
  Administered 2023-03-23: 5 mg via ORAL

## 2023-03-23 MED ORDER — LIDOCAINE 2% (20 MG/ML) 5 ML SYRINGE
INTRAMUSCULAR | Status: DC | PRN
  Administered 2023-03-23: 80 mg via INTRAVENOUS

## 2023-03-23 MED ORDER — BUPIVACAINE LIPOSOME 1.3 % IJ SUSP
INTRAMUSCULAR | Status: AC
  Filled 2023-03-23: qty 10

## 2023-03-23 MED ORDER — SODIUM CHLORIDE 0.9 % IV SOLN
INTRAVENOUS | Status: DC
  Administered 2023-03-23: 1000 mL via INTRAVENOUS

## 2023-03-23 MED ORDER — DROPERIDOL 2.5 MG/ML IJ SOLN: 0.6250 mg | Freq: Once | INTRAMUSCULAR | Status: DC | PRN

## 2023-03-23 MED ORDER — OXYCODONE HCL 5 MG PO TABS
ORAL_TABLET | ORAL | Status: AC
  Filled 2023-03-23: qty 1

## 2023-03-23 MED ORDER — ONDANSETRON HCL 4 MG/2ML IJ SOLN
INTRAMUSCULAR | Status: DC | PRN
  Administered 2023-03-23: 4 mg via INTRAVENOUS

## 2023-03-23 SURGICAL SUPPLY — 69 items
2.8 drill IMPLANT
BANDAGE ESMARK 6X9 LF (GAUZE/BANDAGES/DRESSINGS) ×1 IMPLANT
BIT DRILL 2.4X140 LONG SOLID (BIT) IMPLANT
BIT DRILL LNG 140X2.8XSLD (BIT) IMPLANT
BIT DRL LNG 140X2.8XSLD (BIT) ×1 IMPLANT
BLADE SURG 15 STRL LF DISP TIS (BLADE) ×2 IMPLANT
BNDG COHESIVE 4X5 TAN STRL LF (GAUZE/BANDAGES/DRESSINGS) IMPLANT
BNDG ELASTIC 6X15 VLCR STRL LF (GAUZE/BANDAGES/DRESSINGS) ×1 IMPLANT
BNDG ESMARK 6X9 LF (GAUZE/BANDAGES/DRESSINGS) ×1 IMPLANT
BNDG GAUZE DERMACEA FLUFF 4 (GAUZE/BANDAGES/DRESSINGS) ×1 IMPLANT
BRUSH SCRUB EZ 4% CHG (MISCELLANEOUS) ×1 IMPLANT
CHLORAPREP W/TINT 26 (MISCELLANEOUS) ×2 IMPLANT
COVER BACK TABLE 60X90IN (DRAPES) ×1 IMPLANT
CUFF TRNQT CYL 34X4.125X (TOURNIQUET CUFF) ×1 IMPLANT
DRAPE C-ARM 42X120 X-RAY (DRAPES) ×1 IMPLANT
DRAPE C-ARMOR (DRAPES) ×1 IMPLANT
DRAPE EXTREMITY T 121X128X90 (DISPOSABLE) ×1 IMPLANT
DRAPE IMP U-DRAPE 54X76 (DRAPES) ×1 IMPLANT
DRAPE SHEET LG 3/4 BI-LAMINATE (DRAPES) ×1 IMPLANT
DRAPE U-SHAPE 47X51 STRL (DRAPES) ×1 IMPLANT
DRSG MEPITEL 4X7.2 (GAUZE/BANDAGES/DRESSINGS) ×1 IMPLANT
DRSG MEPITEL 8X12 (GAUZE/BANDAGES/DRESSINGS) IMPLANT
ELECT REM PT RETURN 9FT ADLT (ELECTROSURGICAL) ×1 IMPLANT
ELECTRODE REM PT RTRN 9FT ADLT (ELECTROSURGICAL) ×1 IMPLANT
GAUZE PAD ABD 8X10 STRL (GAUZE/BANDAGES/DRESSINGS) ×5 IMPLANT
GAUZE SPONGE 4X4 12PLY STRL (GAUZE/BANDAGES/DRESSINGS) ×1 IMPLANT
GAUZE SPONGE 4X4 12PLY STRL LF (GAUZE/BANDAGES/DRESSINGS) IMPLANT
GLOVE BIOGEL PI IND STRL 7.0 (GLOVE) IMPLANT
GLOVE BIOGEL PI IND STRL 8 (GLOVE) ×1 IMPLANT
GLOVE SURG SS PI 7.5 STRL IVOR (GLOVE) ×2 IMPLANT
GOWN STRL REUS W/ TWL LRG LVL3 (GOWN DISPOSABLE) ×1 IMPLANT
MARKER SKIN DUAL TIP RULER LAB (MISCELLANEOUS) IMPLANT
NDL HYPO 25X1 1.5 SAFETY (NEEDLE) IMPLANT
NEEDLE HYPO 25X1 1.5 SAFETY (NEEDLE) IMPLANT
NS IRRIG 1000ML POUR BTL (IV SOLUTION) ×1 IMPLANT
PACK BASIN DAY SURGERY FS (CUSTOM PROCEDURE TRAY) ×1 IMPLANT
PADDING CAST ABS COTTON 4X4 ST (CAST SUPPLIES) IMPLANT
PADDING CAST SYNTHETIC 3X4 NS (CAST SUPPLIES) IMPLANT
PADDING CAST SYNTHETIC 4X4 STR (CAST SUPPLIES) ×4 IMPLANT
PADDING CAST SYNTHETIC 6X4 NS (CAST SUPPLIES) ×3 IMPLANT
PENCIL SMOKE EVACUATOR (MISCELLANEOUS) IMPLANT
PLATE FIB STRT 7H (Plate) IMPLANT
PLATE MEDIAL MALLEOLUS 4H HOOK (Plate) IMPLANT
SCREW 3.5X32 NONLOCKING (Screw) IMPLANT
SCREW LOCK PLATE R3 3.5X10 (Screw) IMPLANT
SCREW LOCK PLATE R3 3.5X12 (Screw) IMPLANT
SCREW LOCK PLATE R3 3.5X14 (Screw) IMPLANT
SCREW LOCK PLATE R3 3.5X34 (Screw) IMPLANT
SCREW NL R3CON 4.2X50 (Screw) IMPLANT
SCREW NON LOCKING 3.5X12 (Screw) IMPLANT
SLEEVE SCD COMPRESS KNEE MED (STOCKING) ×1 IMPLANT
SPIKE FLUID TRANSFER (MISCELLANEOUS) IMPLANT
SPLINT FIBERGLASS 4X30 (CAST SUPPLIES) IMPLANT
SPLINT PLASTER CAST FAST 5X30 (CAST SUPPLIES) ×20 IMPLANT
SPONGE T-LAP 18X18 ~~LOC~~+RFID (SPONGE) IMPLANT
STAPLER VISISTAT (STAPLE) IMPLANT
STOCKINETTE 6 STRL (DRAPES) ×1 IMPLANT
SUCTION TUBE FRAZIER 10FR DISP (SUCTIONS) IMPLANT
SUT ETHILON 2 0 FS 18 (SUTURE) ×1 IMPLANT
SUT MNCRL AB 3-0 PS2 18 (SUTURE) IMPLANT
SUT VIC AB 0 SH 27 (SUTURE) IMPLANT
SUT VIC AB 2-0 SH 27XBRD (SUTURE) IMPLANT
SUT VIC AB 3-0 SH 27X BRD (SUTURE) IMPLANT
SYR BULB EAR ULCER 3OZ GRN STR (SYRINGE) ×1 IMPLANT
SYR CONTROL 10ML LL (SYRINGE) IMPLANT
TOWEL OR 17X24 6PK STRL BLUE (TOWEL DISPOSABLE) ×2 IMPLANT
TUBE CONNECTING 12X1/4 (SUCTIONS) ×1 IMPLANT
UNDERPAD 30X36 HEAVY ABSORB (UNDERPADS AND DIAPERS) ×1 IMPLANT
WATER STERILE IRR 500ML POUR (IV SOLUTION) IMPLANT

## 2023-03-23 NOTE — Progress Notes (Signed)
AssistedDr. Malen Gauze with left, popliteal/saphenous, ultrasound guided block. Side rails up, monitors on throughout procedure. See vital signs in flow sheet. Tolerated Procedure well.

## 2023-03-23 NOTE — Op Note (Signed)
03/23/2023  10:09 AM   PATIENT: Kayla Haley  22 y.o. female  MRN: [id number]   PRE-OPERATIVE DIAGNOSIS:   Closed pilon and fibula fractures of left ankle   POST-OPERATIVE DIAGNOSIS:   Same   PROCEDURE: 1] ORIF left distal tibia pilon fracture with internal fixation of fibula 2] ORIF left ankle syndesmosis   SURGEON:  Netta Cedars, MD   ASSISTANT: None   ANESTHESIA: General, regional   EBL: Minimal   TOURNIQUET:    Total Tourniquet Time Documented: Thigh (Left) - 49 minutes Total: Thigh (Left) - 49 minutes    COMPLICATIONS: None apparent   DISPOSITION: Extubated, awake and stable to recovery.   INDICATION FOR PROCEDURE: The patient presented with above diagnosis.  We discussed the diagnosis, alternative treatment options, risks and benefits of the above surgical intervention, as well as alternative non-operative treatments. All questions/concerns were addressed and the patient/family demonstrated appropriate understanding of the diagnosis, the procedure, the postoperative course, and overall prognosis. The patient wished to proceed with surgical intervention and signed an informed surgical consent as such, in each others presence prior to surgery.   PROCEDURE IN DETAIL: After preoperative consent was obtained and the correct operative site was identified, the patient was brought to the operating room supine on stretcher and transferred onto operating table. General anesthesia was induced. Preoperative antibiotics were administered. Surgical timeout was taken. The patient was then positioned supine with an ipsilateral hip bump. The operative lower extremity was prepped and draped in standard sterile fashion with a tourniquet around the thigh. The extremity was exsanguinated and the tourniquet was inflated to 275 mmHg.  A standard lateral incision was made over the distal fibula. Dissection was carried down to the level of the fibula and the fracture site  identified. The superficial peroneal nerve was identified and protected throughout the procedure. The fibula was noted to be shortened with interposed periosteum. The fibula was brought out to length. The fibula fracture was debrided and the edges defined to achieve cortical read. Reduction maneuver was performed using pointed reduction forceps and lobster forceps. In this manner, the fibula length was restored and fracture reduced. A lag screw was not placed given the orientation of fracture lines and comminution. Due to poor bone quality and extensive comminution at the fracture site, it was decided to use a locking distal fibula plate. We then selected a Paragon locking plate to match the anatomy of the distal fibula and placed it laterally. This was implanted under intraoperative fluoroscopy with a combination of distal locking screws and proximal cortical & locking screws.  We then made a direct medial ankle approach and extended this proximally in anticipation of implanting a hook plate. Dissection was carried down to the level of the medial pilon. A dental pick and freer elevator were used to reduce the medial malleolar fragment. Of note, there was extensive comminution of this fragment into multiple segments, all of which had very poor bone quality. Intra-articular osteocartilaginous fragment was removed. A Paragon28 medial distal tibia hook plate was utilized to fix the reduced medial malleolar fragment and the tines were carefully inserted into the distal tip of the malleolus. The plate was oriented to best capture the major fragments of the medial comminution. We placed a non-locking screw in the hook plate and subsequently implanted another locking screw proximally to further secure the plate.  A manual external rotation stress radiograph was obtained and demonstrated widening of the ankle mortise. Given this intraoperative finding as well as preoperative subluxation, it  was decided to reduce and fix  the syndesmosis. Therefore a solid screw was implanted through the fibula plate in appropriate fashion to fix the syndesmosis. Screw position was verified along anteromedial tibial cortex by fluoroscopy. A repeat stress radiograph showed complete stability of the ankle mortise to testing.  The surgical sites were thoroughly irrigated. The tourniquet was deflated and hemostasis achieved. Betadine and vancomycin powder were applied. The deep layers were closed using 2-0 vicryl. The skin was closed without tension.    The leg was cleaned with saline and sterile dressings with gauze were applied. A well padded bulky short leg splint was applied. The patient was awakened from anesthesia and transported to the recovery room in stable condition.    FOLLOW UP PLAN: -transfer to PACU, then home -strict NWB operative extremity, maximum elevation -maintain short leg splint until follow up -DVT ppx: Aspirin 81 mg twice daily while NWB -smoking cessation counseling -follow up as outpatient within 7-10 days for wound check with exchange of short leg splint to short leg cast -sutures out in 2-3 weeks in outpatient office   RADIOGRAPHS: AP, lateral, oblique and stress radiographs of the operative ankle were obtained intraoperatively. These showed interval reduction and fixation of the fractures. Manual stress radiographs were taken and the joints were noted to be stable following fixation. All hardware is appropriately positioned and of the appropriate lengths. No other acute injuries are noted.   Netta Cedars Orthopaedic Surgery [facility]

## 2023-03-23 NOTE — Transfer of Care (Signed)
Immediate Anesthesia Transfer of Care Note  Patient: Kayla Haley  Procedure(s) Performed: Procedure(s) (LRB): OPEN REDUCTION INTERNAL FIXATION (ORIF) BIMALLEOLAR ANKLE FRACTURE (Left) POSSIBLE SYNDESMOSIS REPAIR (Left)  Patient Location: PACU  Anesthesia Type: GA  Level of Consciousness: awake, sedated, patient cooperative and responds to stimulation  Airway & Oxygen Therapy: Patient Spontanous Breathing and Patient connected to [place] oxygen  Post-op Assessment: Report given to PACU RN, Post -op Vital signs reviewed and stable and Patient moving all extremities  Post vital signs: Reviewed and stable  Complications: No apparent anesthesia complications

## 2023-03-23 NOTE — Anesthesia Procedure Notes (Signed)
Procedure Name: LMA Insertion Date/Time: 03/23/2023 8:47 AM  Performed by: Dairl Ponder, CRNAPre-anesthesia Checklist: Patient identified, Emergency Drugs available, Suction available and Patient being monitored Patient Re-evaluated:Patient Re-evaluated prior to induction Oxygen Delivery Method: Circle System Utilized Preoxygenation: Pre-oxygenation with 100% oxygen Induction Type: IV induction Ventilation: Mask ventilation without difficulty LMA: LMA inserted LMA Size: 3.0 Number of attempts: 1 Airway Equipment and Method: Bite block Placement Confirmation: positive ETCO2 Tube secured with: Tape Dental Injury: Teeth and Oropharynx as per pre-operative assessment

## 2023-03-23 NOTE — H&P (Signed)

## 2023-03-23 NOTE — Anesthesia Postprocedure Evaluation (Signed)
Anesthesia Post Note  Patient: Kayla Haley  Procedure(s) Performed: OPEN REDUCTION INTERNAL FIXATION (ORIF) BIMALLEOLAR ANKLE FRACTURE (Left: Ankle) SYNDESMOSIS REPAIR (Left: Ankle)     Patient location during evaluation: PACU Anesthesia Type: General Level of consciousness: awake and alert and oriented Pain management: pain level controlled Vital Signs Assessment: post-procedure vital signs reviewed and stable Respiratory status: spontaneous breathing, nonlabored ventilation and respiratory function stable Cardiovascular status: blood pressure returned to baseline and stable Postop Assessment: no apparent nausea or vomiting Anesthetic complications: no   No notable events documented.  Last Vitals:  Vitals:   03/23/23 0815 03/23/23 1007  BP: 115/68 113/66  Pulse: 62 84  Resp: 14 11  Temp:  36.7 C  SpO2: 100% 100%    Last Pain:  Vitals:   03/23/23 1007  TempSrc:   PainSc: Asleep                 Francys Bolin A.

## 2023-03-23 NOTE — Anesthesia Procedure Notes (Signed)
Anesthesia Regional Block: Adductor canal block   Pre-Anesthetic Checklist: , timeout performed,  Correct Patient, Correct Site, Correct Laterality,  Correct Procedure, Correct Position, site marked,  Risks and benefits discussed,  Surgical consent,  Pre-op evaluation,  At surgeon's request and post-op pain management  Laterality: Left  Prep: chloraprep       Needles:  Injection technique: Single-shot  Needle Type: Echogenic Stimulator Needle     Needle Length: 9cm  Needle Gauge: 21   Needle insertion depth: 7 cm   Additional Needles:   Procedures:,,,, ultrasound used (permanent image in chart),,    Narrative:  Start time: 03/23/2023 8:12 AM End time: 03/23/2023 8:17 AM Injection made incrementally with aspirations every 5 mL.  Performed by: Personally  Anesthesiologist: Mal Amabile, MD  Additional Notes: Timeout performed. Patient sedated. Relevant anatomy ID'd using [place]. Incremental 2-72ml injection of LA with frequent aspiration. Patient tolerated procedure well.

## 2023-03-23 NOTE — Anesthesia Procedure Notes (Signed)
Anesthesia Regional Block: Popliteal block   Pre-Anesthetic Checklist: , timeout performed,  Correct Patient, Correct Site, Correct Laterality,  Correct Procedure, Correct Position, site marked,  Risks and benefits discussed,  Surgical consent,  Pre-op evaluation,  At surgeon's request and post-op pain management  Laterality: Left  Prep: chloraprep       Needles:  Injection technique: Single-shot  Needle Type: Echogenic Stimulator Needle     Needle Length: 9cm  Needle Gauge: 21   Needle insertion depth: 6 cm   Additional Needles:   Procedures:,,,, ultrasound used (permanent image in chart),,    Narrative:  Start time: 03/23/2023 8:07 AM End time: 03/23/2023 8:12 AM Injection made incrementally with aspirations every 5 mL.  Performed by: Personally  Anesthesiologist: Mal Amabile, MD  Additional Notes: Timeout performed. Patient sedated. Relevant anatomy ID'd using [place]. Incremental 2-81ml injection of LA with frequent aspiration. Patient tolerated procedure well.

## 2023-03-23 NOTE — Anesthesia Preprocedure Evaluation (Addendum)
Anesthesia Evaluation  Patient identified by MRN, date of birth, ID band Patient awake    Reviewed: Allergy & Precautions, NPO status , Patient's Chart, lab work & pertinent test results  Airway Mallampati: I  TM Distance: >3 FB     Dental no notable dental hx. (+) Teeth Intact, Dental Advisory Given   Pulmonary neg pulmonary ROS   Pulmonary exam normal breath sounds clear to auscultation       Cardiovascular negative cardio ROS Normal cardiovascular exam Rhythm:Regular Rate:Normal     Neuro/Psych negative neurological ROS  negative psych ROS   GI/Hepatic negative GI ROS, Neg liver ROS,,,  Endo/Other  negative endocrine ROS    Renal/GU negative Renal ROS  negative genitourinary   Musculoskeletal Bimalleolar Fx left ankle   Abdominal   Peds  Hematology  (+) Blood dyscrasia, anemia   Anesthesia Other Findings   Reproductive/Obstetrics                             Anesthesia Physical Anesthesia Plan  ASA: 2  Anesthesia Plan: General   Post-op Pain Management: Regional block* and Minimal or no pain anticipated   Induction: Intravenous  PONV Risk Score and Plan: 4 or greater and Treatment may vary due to age or medical condition, Midazolam, Scopolamine patch - Pre-op, Ondansetron and Dexamethasone  Airway Management Planned: LMA  Additional Equipment: None  Intra-op Plan:   Post-operative Plan: Extubation in OR  Informed Consent: I have reviewed the patients History and Physical, chart, labs and discussed the procedure including the risks, benefits and alternatives for the proposed anesthesia with the patient or authorized representative who has indicated his/her understanding and acceptance.     Dental advisory given  Plan Discussed with: CRNA and Anesthesiologist  Anesthesia Plan Comments:         Anesthesia Quick Evaluation

## 2023-03-25 ENCOUNTER — Encounter (HOSPITAL_BASED_OUTPATIENT_CLINIC_OR_DEPARTMENT_OTHER): Payer: Self-pay | Admitting: Orthopaedic Surgery

## 2023-04-09 ENCOUNTER — Encounter: Payer: Self-pay | Admitting: Family

## 2023-04-09 ENCOUNTER — Ambulatory Visit: Payer: Medicaid Other | Admitting: Family

## 2023-04-09 ENCOUNTER — Other Ambulatory Visit (HOSPITAL_COMMUNITY)
Admission: RE | Admit: 2023-04-09 | Discharge: 2023-04-09 | Disposition: A | Discharge status: Home / Self Care | Payer: Medicaid Other | Source: Ambulatory Visit | Attending: Family | Admitting: Family

## 2023-04-09 VITALS — BP 114/77 | Temp 98.0°F | Ht 61.0 in | Wt 126.1 lb

## 2023-04-09 DIAGNOSIS — Z113 Encounter for screening for infections with a predominantly sexual mode of transmission: Secondary | ICD-10-CM | POA: Insufficient documentation

## 2023-04-09 DIAGNOSIS — Z9889 Other specified postprocedural states: Secondary | ICD-10-CM | POA: Diagnosis not present

## 2023-04-09 DIAGNOSIS — Z8781 Personal history of (healed) traumatic fracture: Secondary | ICD-10-CM | POA: Diagnosis not present

## 2023-04-09 NOTE — Progress Notes (Signed)
Patient ID: Kayla Haley, female    DOB: 2000/10/04, 22 y.o.   MRN: [id number]  Chief Complaint  Patient presents with   STD testing    Pt denies any sx.    Discussed the use of AI scribe software for clinical note transcription with the patient, who gave verbal consent to proceed.  History of Present Illness   The patient, with a recent history of a traumatic ankle injury, presents for routine sexually transmitted infection testing. The patient reports that the injury occurred while chasing a friend, resulting in the friend falling on her leg. The patient initially attempted to reposition the ankle herself, which she believes may have contributed to the severity of the injury. The injury was severe enough to require surgery and the patient is currently in a cast and using crutches for mobility. The patient is unsure of the duration she will need to wear the cast, but reports that she is due to have staples removed in two weeks. The patient denies any specific concerns or symptoms related to sexually transmitted infections, but wishes to be tested for safety.     Assessment & Plan:     Ankle Fracture - Recent left ankle fracture due to a fall, with surgical intervention performed. Currently in a cast with no weight bearing allowed. Patient experiencing difficulty with mobility using crutches. -Recommend obtaining a knee scooter for improved mobility and comfort. Suggested looking into affordable options via [employer] or local medical supply stores. -Next appointment for staple removal in two weeks. -Advised on looking for used knee scooter online or via [employer] medical supply store in [place].  Sexual Health - Request for routine sexually transmitted infection (STI) testing, no new partners or symptoms reported. -Collect urine sample for STI testing today. -Results will be reviewed next week on MyChart.     Subjective:    Outpatient Medications Prior to Visit  Medication Sig  Dispense Refill   acetaminophen (TYLENOL) 500 MG tablet Take 1,000 mg by mouth every 6 (six) hours as needed.     aspirin EC 81 MG tablet Take 81 mg by mouth daily. Swallow whole.     ferrous sulfate 325 (65 FE) MG EC tablet Take 1 tablet (325 mg total) by mouth daily with breakfast. (Patient taking differently: Take 325 mg by mouth daily with breakfast.) 90 tablet 1   naproxen (NAPROSYN) 375 MG tablet Take 1 tablet (375 mg total) by mouth 2 (two) times daily with a meal. (Patient not taking: Reported on 04/09/2023) 20 tablet 0   No facility-administered medications prior to visit.   Past Medical History:  Diagnosis Date   Closed left ankle fracture 02/28/2023   ED visit in epic;  s/p closed reduction w/ sedation   History of chlamydia 12/2022   treated   Iron deficiency anemia due to chronic blood loss    Menorrhagia with regular cycle    Past Surgical History:  Procedure Laterality Date   ORIF ANKLE FRACTURE Left 03/23/2023   Procedure: OPEN REDUCTION INTERNAL FIXATION (ORIF) BIMALLEOLAR ANKLE FRACTURE;  Surgeon: Netta Cedars, MD;  Location: [facility];  Service: Orthopedics;  Laterality: Left;   SYNDESMOSIS REPAIR Left 03/23/2023   Procedure: SYNDESMOSIS REPAIR;  Surgeon: Netta Cedars, MD;  Location: [facility];  Service: Orthopedics;  Laterality: Left;   TONSILLECTOMY AND ADENOIDECTOMY  12/13/2003   @ [facility] by dr j. byers   WISDOM TOOTH EXTRACTION Bilateral 2020   No Known Allergies    Objective:  Physical Exam Vitals and nursing note reviewed.  Constitutional:      Appearance: Normal appearance.  Cardiovascular:     Rate and Rhythm: Normal rate and regular rhythm.  Pulmonary:     Effort: Pulmonary effort is normal.     Breath sounds: Normal breath sounds.  Musculoskeletal:        General: Normal range of motion.     Comments: Noted open toe cast on left foot up to below knee. NWB, using crutches.  Skin:    General: Skin is  warm and dry.  Neurological:     Mental Status: She is alert.  Psychiatric:        Mood and Affect: Mood normal.        Behavior: Behavior normal.    BP 114/77 (BP Location: Left Arm, Patient Position: Sitting, Cuff Size: Normal)   Temp 98 F (36.7 C) (Temporal)   Ht 5\' 1"  (1.549 m)   Wt 126 lb 2 oz (57.2 kg)   LMP 03/19/2023 (Exact Date)   BMI 23.83 kg/m  Wt Readings from Last 3 Encounters:  04/09/23 126 lb 2 oz (57.2 kg)  03/23/23 126 lb 1.6 oz (57.2 kg)  02/28/23 125 lb 10.6 oz (57 kg)      Dulce Sellar, NP

## 2023-04-10 LAB — URINE CYTOLOGY ANCILLARY ONLY
Chlamydia: NEGATIVE
Comment: NEGATIVE
Comment: NEGATIVE
Comment: NORMAL
Neisseria Gonorrhea: NEGATIVE
Trichomonas: NEGATIVE

## 2023-07-22 ENCOUNTER — Ambulatory Visit: Admitting: Family

## 2023-07-22 ENCOUNTER — Other Ambulatory Visit (HOSPITAL_COMMUNITY)
Admission: RE | Admit: 2023-07-22 | Discharge: 2023-07-22 | Disposition: A | Discharge status: Home / Self Care | Source: Ambulatory Visit | Attending: Family | Admitting: Family

## 2023-07-22 ENCOUNTER — Encounter: Payer: Self-pay | Admitting: Family

## 2023-07-22 VITALS — BP 110/72 | HR 79 | Temp 98.4°F | Ht 61.0 in | Wt 129.2 lb

## 2023-07-22 DIAGNOSIS — Z9889 Other specified postprocedural states: Secondary | ICD-10-CM

## 2023-07-22 DIAGNOSIS — N760 Acute vaginitis: Secondary | ICD-10-CM | POA: Diagnosis not present

## 2023-07-22 DIAGNOSIS — D500 Iron deficiency anemia secondary to blood loss (chronic): Secondary | ICD-10-CM | POA: Diagnosis not present

## 2023-07-22 DIAGNOSIS — Z124 Encounter for screening for malignant neoplasm of cervix: Secondary | ICD-10-CM

## 2023-07-22 DIAGNOSIS — Z113 Encounter for screening for infections with a predominantly sexual mode of transmission: Secondary | ICD-10-CM | POA: Insufficient documentation

## 2023-07-22 DIAGNOSIS — Z8781 Personal history of (healed) traumatic fracture: Secondary | ICD-10-CM

## 2023-07-22 DIAGNOSIS — B9689 Other specified bacterial agents as the cause of diseases classified elsewhere: Secondary | ICD-10-CM

## 2023-07-22 LAB — CBC WITH DIFFERENTIAL/PLATELET
Basophils Absolute: 0 10*3/uL (ref 0.0–0.1)
Basophils Relative: 0.4 % (ref 0.0–3.0)
Eosinophils Absolute: 0.1 10*3/uL (ref 0.0–0.7)
Eosinophils Relative: 1.9 % (ref 0.0–5.0)
HCT: 34.6 % — ABNORMAL LOW (ref 36.0–46.0)
Hemoglobin: 11.3 g/dL — ABNORMAL LOW (ref 12.0–15.0)
Lymphocytes Relative: 30.6 % (ref 12.0–46.0)
Lymphs Abs: 2.3 10*3/uL (ref 0.7–4.0)
MCHC: 32.5 g/dL (ref 30.0–36.0)
MCV: 87.1 fl (ref 78.0–100.0)
Monocytes Absolute: 0.5 10*3/uL (ref 0.1–1.0)
Monocytes Relative: 7.1 % (ref 3.0–12.0)
Neutro Abs: 4.4 10*3/uL (ref 1.4–7.7)
Neutrophils Relative %: 60 % (ref 43.0–77.0)
Platelets: 227 10*3/uL (ref 150.0–400.0)
RBC: 3.98 Mil/uL (ref 3.87–5.11)
RDW: 16.4 % — ABNORMAL HIGH (ref 11.5–15.5)
WBC: 7.4 10*3/uL (ref 4.0–10.5)

## 2023-07-22 MED ORDER — FERROUS SULFATE 325 (65 FE) MG PO TBEC: 325.0000 mg | DELAYED_RELEASE_TABLET | Freq: Every day | ORAL | 3 refills | Status: DC

## 2023-07-22 NOTE — Progress Notes (Signed)
 Patient ID: Kayla Haley, female    DOB: 2000-08-27, 23 y.o.   MRN: [id number]  Chief Complaint  Patient presents with   STD testing     Pt would like STD testing and BV, pt denies sx.   Discussed the use of AI scribe software for clinical note transcription with the patient, who gave verbal consent to proceed.  History of Present Illness The patient, with a history of anemia, presents for a follow-up and STI screening. She was started on iron supplementation in September of the previous year due to heavy menstrual cycles. She reports some improvement in her cycles, but they are still a bit heavy. She has been taking the iron supplement most days, but occasionally misses a dose due to a busy schedule. The iron supplement has not caused any significant side effects such as nausea or constipation, and she has not noticed a change in stool color. She has not been craving ice or dirt, which are common pica symptoms in iron deficiency anemia. She also denies any unusual fatigue, shortness of breath, chest pain, or headaches, which can be symptoms of anemia. In addition, the patient reports her fractured left foot that she had surgery on back in Dec is requiring a second surgery. She also mentions seeing different floaters in her vision, which is a new symptom for her.  Assessment & Plan Ankle fracture - Seeing [facility]. First fracture in Dec., had sgy, wore cast & boot, now requires another surgery next week to remove the hardware. - Provide support for upcoming surgery.  Iron Deficiency Anemia - Dx 12/2022, and takes RX iron supplement with some missed doses. Reports improved but still heavy menstrual cycles. No adverse symptoms from supplementation. Emphasized importance of consistent iron intake, especially during menstruation. - Order CBC to monitor anemia status. - Refill ferrous sulfate supplement. - Advise consistent intake of iron supplements, especially around menstrual period. - F/U  in 2m-35yr or sooner based on lab result.  STI screening -  In a new relationship, would like screenings done, denies any symptoms. Continue to remind pt on safe practices.    Subjective:    Outpatient Medications Prior to Visit  Medication Sig Dispense Refill   ferrous sulfate 325 (65 FE) MG EC tablet Take 1 tablet (325 mg total) by mouth daily with breakfast. (Patient taking differently: Take 325 mg by mouth daily with breakfast.) 90 tablet 1   acetaminophen (TYLENOL) 500 MG tablet Take 1,000 mg by mouth every 6 (six) hours as needed. (Patient not taking: Reported on 07/22/2023)     aspirin EC 81 MG tablet Take 81 mg by mouth daily. Swallow whole. (Patient not taking: Reported on 07/22/2023)     naproxen (NAPROSYN) 375 MG tablet Take 1 tablet (375 mg total) by mouth 2 (two) times daily with a meal. (Patient not taking: Reported on 07/22/2023) 20 tablet 0   No facility-administered medications prior to visit.   Past Medical History:  Diagnosis Date   Closed left ankle fracture 02/28/2023   ED visit in epic;  s/p closed reduction w/ sedation   History of chlamydia 12/2022   treated   Iron deficiency anemia due to chronic blood loss    Menorrhagia with regular cycle    Past Surgical History:  Procedure Laterality Date   ORIF ANKLE FRACTURE Left 03/23/2023   Procedure: OPEN REDUCTION INTERNAL FIXATION (ORIF) BIMALLEOLAR ANKLE FRACTURE;  Surgeon: Netta Cedars, MD;  Location: [facility];  Service: Orthopedics;  Laterality: Left;  SYNDESMOSIS REPAIR Left 03/23/2023   Procedure: SYNDESMOSIS REPAIR;  Surgeon: Netta Cedars, MD;  Location: [facility];  Service: Orthopedics;  Laterality: Left;   TONSILLECTOMY AND ADENOIDECTOMY  12/13/2003   @ [facility] by dr j. byers   WISDOM TOOTH EXTRACTION Bilateral 2020   No Known Allergies    Objective:    Physical Exam Vitals and nursing note reviewed.  Constitutional:      Appearance: Normal appearance.   Cardiovascular:     Rate and Rhythm: Normal rate and regular rhythm.  Pulmonary:     Effort: Pulmonary effort is normal.     Breath sounds: Normal breath sounds.  Musculoskeletal:        General: Normal range of motion.  Skin:    General: Skin is warm and dry.  Neurological:     Mental Status: She is alert.  Psychiatric:        Mood and Affect: Mood normal.        Behavior: Behavior normal.    BP 110/72 (BP Location: Left Arm, Patient Position: Sitting, Cuff Size: Normal)   Pulse 79   Temp 98.4 F (36.9 C) (Temporal)   Ht 5\' 1"  (1.549 m)   Wt 129 lb 3.2 oz (58.6 kg)   LMP 07/04/2023 (Approximate)   SpO2 100%   BMI 24.41 kg/m  Wt Readings from Last 3 Encounters:  07/22/23 129 lb 3.2 oz (58.6 kg)  04/09/23 126 lb 2 oz (57.2 kg)  03/23/23 126 lb 1.6 oz (57.2 kg)       Dulce Sellar, NP

## 2023-07-23 ENCOUNTER — Other Ambulatory Visit: Payer: Self-pay

## 2023-07-23 ENCOUNTER — Encounter (HOSPITAL_BASED_OUTPATIENT_CLINIC_OR_DEPARTMENT_OTHER): Payer: Self-pay | Admitting: Orthopaedic Surgery

## 2023-07-23 ENCOUNTER — Encounter: Payer: Self-pay | Admitting: Family

## 2023-07-23 LAB — CERVICOVAGINAL ANCILLARY ONLY
Bacterial Vaginitis (gardnerella): POSITIVE — AB
Candida Glabrata: NEGATIVE
Candida Vaginitis: NEGATIVE
Chlamydia: NEGATIVE
Comment: NEGATIVE
Comment: NEGATIVE
Comment: NEGATIVE
Comment: NEGATIVE
Comment: NEGATIVE
Comment: NORMAL
Neisseria Gonorrhea: NEGATIVE
Trichomonas: NEGATIVE

## 2023-07-27 MED ORDER — METRONIDAZOLE 500 MG PO TABS: 500.0000 mg | ORAL_TABLET | Freq: Two times a day (BID) | ORAL | 0 refills | Status: AC

## 2023-07-27 NOTE — Addendum Note (Signed)
 Addended by: Nilam Quakenbush on: 07/27/2023 07:34 AM   Modules accepted: Orders

## 2023-07-28 NOTE — Discharge Instructions (Signed)
 Netta Cedars, MD [facility]  Please read the following information regarding your care after surgery.  Medications  You only need a prescription for the narcotic pain medicine (ex. oxycodone, Percocet, Norco).  All of the other medicines listed below are available over the counter. ? Aleve 2 pills twice a day for the first 3 days after surgery. ? acetominophen (Tylenol) 650 mg every 4-6 hours as you need for minor to moderate pain ? oxycodone as prescribed for severe pain  ? To help prevent blood clots, take aspirin (81 mg) twice daily for 28 days after surgery.  You should also get up every hour while you are awake to move around.  Weight Bearing ? OK to walk on the operative leg only AFTER the nerve block has completely worn off.  Cast / Splint / Dressing ? Keep your dressing clean and dry.  Don't put anything (coat hanger, pencil, etc) down inside of it.  If it gets wet, please notify the office immediately.  Swelling IMPORTANT: It is normal for you to have swelling where you had surgery. To reduce swelling and pain, keep at least 3 pillows under your leg so that your toes are above your nose and your heel is above the level of your hip.  It may be necessary to keep your foot or leg elevated for several weeks.  This is critical to helping your incisions heal and your pain to feel better.  Follow Up Call my office at [phone] when you are discharged from the hospital or surgery center to schedule an appointment to be seen within 7-10 days after surgery.  Call my office at [phone] if you develop a fever >101.5 F, nausea, vomiting, bleeding from the surgical site or severe pain.   Post Anesthesia Home Care Instructions  Activity: Get plenty of rest for the remainder of the day. A responsible individual must stay with you for 24 hours following the procedure.  For the next 24 hours, DO NOT: -Drive a car -Advertising copywriter -Drink alcoholic beverages -Take any  medication unless instructed by your physician -Make any legal decisions or sign important papers.  Meals: Start with liquid foods such as gelatin or soup. Progress to regular foods as tolerated. Avoid greasy, spicy, heavy foods. If nausea and/or vomiting occur, drink only clear liquids until the nausea and/or vomiting subsides. Call your physician if vomiting continues.  Special Instructions/Symptoms: Your throat may feel dry or sore from the anesthesia or the breathing tube placed in your throat during surgery. If this causes discomfort, gargle with warm salt water. The discomfort should disappear within 24 hours.  If you had a scopolamine patch placed behind your ear for the management of post- operative nausea and/or vomiting:  1. The medication in the patch is effective for 72 hours, after which it should be removed.  Wrap patch in a tissue and discard in the trash. Wash hands thoroughly with soap and water. 2. You may remove the patch earlier than 72 hours if you experience unpleasant side effects which may include dry mouth, dizziness or visual disturbances. 3. Avoid touching the patch. Wash your hands with soap and water after contact with the patch.    Post Anesthesia Home Care Instructions  Activity: Get plenty of rest for the remainder of the day. A responsible individual must stay with you for 24 hours following the procedure.  For the next 24 hours, DO NOT: -Drive a car -Advertising copywriter -Drink alcoholic beverages -Take any medication unless instructed by your

## 2023-07-28 NOTE — Anesthesia Preprocedure Evaluation (Signed)
 Anesthesia Evaluation  Patient identified by MRN, date of birth, ID band Patient awake    Reviewed: Allergy & Precautions, NPO status , Patient's Chart, lab work & pertinent test results  Airway Mallampati: II       Dental no notable dental hx. (+) Teeth Intact, Dental Advisory Given   Pulmonary neg pulmonary ROS   Pulmonary exam normal breath sounds clear to auscultation       Cardiovascular negative cardio ROS Normal cardiovascular exam Rhythm:Regular Rate:Normal     Neuro/Psych negative neurological ROS  negative psych ROS   GI/Hepatic negative GI ROS, Neg liver ROS,,,  Endo/Other  negative endocrine ROS    Renal/GU negative Renal ROS  negative genitourinary   Musculoskeletal Closed bimalleolar Fx left ankle- retained hardware   Abdominal   Peds  Hematology  (+) Blood dyscrasia, anemia   Anesthesia Other Findings   Reproductive/Obstetrics                             Anesthesia Physical Anesthesia Plan  ASA: 2  Anesthesia Plan: General   Post-op Pain Management: Minimal or no pain anticipated   Induction: Intravenous  PONV Risk Score and Plan: 4 or greater and Treatment may vary due to age or medical condition and Midazolam  Airway Management Planned: LMA  Additional Equipment: None  Intra-op Plan:   Post-operative Plan: Extubation in OR  Informed Consent: I have reviewed the patients History and Physical, chart, labs and discussed the procedure including the risks, benefits and alternatives for the proposed anesthesia with the patient or authorized representative who has indicated his/her understanding and acceptance.     Dental advisory given  Plan Discussed with: CRNA and Anesthesiologist  Anesthesia Plan Comments:        Anesthesia Quick Evaluation

## 2023-07-28 NOTE — H&P (Signed)
 ORTHOPAEDIC SURGERY H&P  Subjective:  The patient presents with left ankle hardware.   Past Medical History:  Diagnosis Date   Closed left ankle fracture 02/28/2023   ED visit in epic;  s/p closed reduction w/ sedation   History of chlamydia 12/2022   treated   Iron deficiency anemia due to chronic blood loss    Menorrhagia with regular cycle     Past Surgical History:  Procedure Laterality Date   ORIF ANKLE FRACTURE Left 03/23/2023   Procedure: OPEN REDUCTION INTERNAL FIXATION (ORIF) BIMALLEOLAR ANKLE FRACTURE;  Surgeon: Ali Ink, MD;  Location: [facility] [place];  Service: Orthopedics;  Laterality: Left;   SYNDESMOSIS REPAIR Left 03/23/2023   Procedure: SYNDESMOSIS REPAIR;  Surgeon: Ali Ink, MD;  Location: [facility] [place];  Service: Orthopedics;  Laterality: Left;   TONSILLECTOMY AND ADENOIDECTOMY  12/13/2003   @ [facility] by dr j. byers   WISDOM TOOTH EXTRACTION Bilateral 2020     (Not in an outpatient encounter)    No Known Allergies  Social History   Socioeconomic History   Marital status: Single    Spouse name: Not on file   Number of children: Not on file   Years of education: Not on file   Highest education level: Not on file  Occupational History   Not on file  Tobacco Use   Smoking status: Never   Smokeless tobacco: Never  Vaping Use   Vaping status: Every Day   Substances: Nicotine, Flavoring   Devices: geek bar  Substance and Sexual Activity   Alcohol use: Not Currently    Comment: 03-19-2023 pt stated average 2 every other week   Drug use: Never   Sexual activity: Yes    Birth control/protection: None  Other Topics Concern   Not on file  Social History Narrative   Not on file   Social Drivers of Health   Financial Resource Strain: Not on file  Food Insecurity: Not on file  Transportation Needs: Not on file  Physical Activity: Not on file  Stress: Not on file  Social Connections: Not on file  Intimate  Partner Violence: Not on file     History reviewed. No pertinent family history.   Review of Systems Pertinent items are noted in HPI.  Objective: Vital signs in last 24 hours:    07/23/2023    4:29 PM 07/22/2023    2:32 PM 04/09/2023   12:55 PM  Vitals with BMI  Height 5\' 1"  5\' 1"  5\' 1"   Weight 129 lbs 3 oz 129 lbs 3 oz 126 lbs 2 oz  BMI 24.42 24.42 23.84  Systolic  110 114  Diastolic  72 77  Pulse  79       EXAM: General: Well nourished, well developed. Awake, alert and oriented to time, place, person. Normal mood and affect. No apparent distress. Breathing room air.  Operative Lower Extremity: Alignment - Neutral Deformity - None Skin intact Tenderness to palpation - None 5/5 TA, PT, GS, Per, EHL, FHL Sensation intact to light touch throughout Palpable DP and PT pulses Special testing: None  The contralateral foot/ankle was examined for comparison and noted to be neurovascularly intact with no localized deformity, swelling, or tenderness.  Imaging Review All images taken were independently reviewed by me.  Assessment/Plan: The clinical and radiographic findings were reviewed and discussed at length with the patient.  The patient has left ankle hardware s/p ORIF.  We spoke at length about the natural course of these findings.  We discussed nonoperative and operative treatment options in detail.  The risks and benefits were presented and reviewed. The risks due to inability to remove part/all of hardware, recurrent instability, hardware failure/irritation, new/persistent/recurrent infection, stiffness, nerve/vessel/tendon injury, nonunion/malunion of any fracture, wound healing issues, allograft usage, development of arthritis, failure of this surgery, possibility of external fixation in certain situations, possibility of delayed definitive surgery, need for further surgery, prolonged wound care including further soft tissue coverage procedures, thromboembolic events,  anesthesia/medical complications/events perioperatively and beyond, amputation, death among others were discussed. The patient acknowledged the explanation and agreed to proceed with the plan.  Ali Ink  Orthopaedic Surgery [facility]

## 2023-07-29 ENCOUNTER — Ambulatory Visit (HOSPITAL_BASED_OUTPATIENT_CLINIC_OR_DEPARTMENT_OTHER): Admitting: Anesthesiology

## 2023-07-29 ENCOUNTER — Ambulatory Visit (HOSPITAL_BASED_OUTPATIENT_CLINIC_OR_DEPARTMENT_OTHER)

## 2023-07-29 ENCOUNTER — Encounter (HOSPITAL_BASED_OUTPATIENT_CLINIC_OR_DEPARTMENT_OTHER): Payer: Self-pay | Admitting: Orthopaedic Surgery

## 2023-07-29 ENCOUNTER — Ambulatory Visit (HOSPITAL_BASED_OUTPATIENT_CLINIC_OR_DEPARTMENT_OTHER)
Admission: RE | Admit: 2023-07-29 | Discharge: 2023-07-29 | Disposition: A | Discharge status: Home / Self Care | Attending: Orthopaedic Surgery | Admitting: Orthopaedic Surgery

## 2023-07-29 ENCOUNTER — Other Ambulatory Visit: Payer: Self-pay

## 2023-07-29 ENCOUNTER — Encounter (HOSPITAL_BASED_OUTPATIENT_CLINIC_OR_DEPARTMENT_OTHER)
Admission: RE | Disposition: A | Discharge status: Home / Self Care | Payer: Self-pay | Source: Home / Self Care | Attending: Orthopaedic Surgery

## 2023-07-29 DIAGNOSIS — F17290 Nicotine dependence, other tobacco product, uncomplicated: Secondary | ICD-10-CM | POA: Diagnosis not present

## 2023-07-29 DIAGNOSIS — Z01818 Encounter for other preprocedural examination: Secondary | ICD-10-CM

## 2023-07-29 DIAGNOSIS — Z472 Encounter for removal of internal fixation device: Secondary | ICD-10-CM | POA: Diagnosis not present

## 2023-07-29 DIAGNOSIS — Z4589 Encounter for adjustment and management of other implanted devices: Secondary | ICD-10-CM | POA: Diagnosis present

## 2023-07-29 DIAGNOSIS — Z8781 Personal history of (healed) traumatic fracture: Secondary | ICD-10-CM | POA: Diagnosis not present

## 2023-07-29 HISTORY — PX: HARDWARE REMOVAL: SHX979

## 2023-07-29 LAB — POCT PREGNANCY, URINE: Preg Test, Ur: NEGATIVE

## 2023-07-29 SURGERY — REMOVAL, HARDWARE
Anesthesia: General | Site: Ankle | Laterality: Left

## 2023-07-29 MED ORDER — FENTANYL CITRATE (PF) 100 MCG/2ML IJ SOLN
INTRAMUSCULAR | Status: AC
  Filled 2023-07-29: qty 2

## 2023-07-29 MED ORDER — MIDAZOLAM HCL 2 MG/2ML IJ SOLN
INTRAMUSCULAR | Status: AC
  Filled 2023-07-29: qty 2

## 2023-07-29 MED ORDER — FENTANYL CITRATE (PF) 100 MCG/2ML IJ SOLN
INTRAMUSCULAR | Status: DC | PRN
  Administered 2023-07-29: 50 ug via INTRAVENOUS

## 2023-07-29 MED ORDER — BUPIVACAINE-EPINEPHRINE (PF) 0.5% -1:200000 IJ SOLN
INTRAMUSCULAR | Status: DC | PRN
  Administered 2023-07-29: 10 mL

## 2023-07-29 MED ORDER — LACTATED RINGERS IV SOLN
INTRAVENOUS | Status: DC
Start: 2023-07-29 — End: 2023-07-29

## 2023-07-29 MED ORDER — DROPERIDOL 2.5 MG/ML IJ SOLN: 0.6250 mg | Freq: Once | INTRAMUSCULAR | Status: DC | PRN

## 2023-07-29 MED ORDER — ONDANSETRON HCL 4 MG/2ML IJ SOLN
INTRAMUSCULAR | Status: AC
  Filled 2023-07-29: qty 12

## 2023-07-29 MED ORDER — MIDAZOLAM HCL 5 MG/5ML IJ SOLN
INTRAMUSCULAR | Status: DC | PRN
  Administered 2023-07-29: 2 mg via INTRAVENOUS

## 2023-07-29 MED ORDER — DEXAMETHASONE SODIUM PHOSPHATE 10 MG/ML IJ SOLN
INTRAMUSCULAR | Status: AC
  Filled 2023-07-29: qty 4

## 2023-07-29 MED ORDER — LIDOCAINE HCL (CARDIAC) PF 100 MG/5ML IV SOSY
PREFILLED_SYRINGE | INTRAVENOUS | Status: DC | PRN
  Administered 2023-07-29: 60 mg via INTRAVENOUS

## 2023-07-29 MED ORDER — CEFAZOLIN SODIUM-DEXTROSE 2-4 GM/100ML-% IV SOLN
2.0000 g | INTRAVENOUS | Status: AC
  Administered 2023-07-29: 2 g via INTRAVENOUS

## 2023-07-29 MED ORDER — FENTANYL CITRATE (PF) 100 MCG/2ML IJ SOLN
25.0000 ug | INTRAMUSCULAR | Status: DC | PRN
  Administered 2023-07-29: 25 ug via INTRAVENOUS
  Administered 2023-07-29: 50 ug via INTRAVENOUS

## 2023-07-29 MED ORDER — PHENYLEPHRINE 80 MCG/ML (10ML) SYRINGE FOR IV PUSH (FOR BLOOD PRESSURE SUPPORT)
PREFILLED_SYRINGE | INTRAVENOUS | Status: AC
  Filled 2023-07-29: qty 30

## 2023-07-29 MED ORDER — ROCURONIUM BROMIDE 10 MG/ML (PF) SYRINGE
PREFILLED_SYRINGE | INTRAVENOUS | Status: AC
  Filled 2023-07-29: qty 30

## 2023-07-29 MED ORDER — PROPOFOL 10 MG/ML IV BOLUS
INTRAVENOUS | Status: DC | PRN
  Administered 2023-07-29: 150 mg via INTRAVENOUS

## 2023-07-29 MED ORDER — CHLORHEXIDINE GLUCONATE 4 % EX SOLN: 60.0000 mL | Freq: Once | CUTANEOUS | Status: DC

## 2023-07-29 MED ORDER — ONDANSETRON HCL 4 MG/2ML IJ SOLN
INTRAMUSCULAR | Status: DC | PRN
  Administered 2023-07-29: 4 mg via INTRAVENOUS

## 2023-07-29 MED ORDER — EPHEDRINE 5 MG/ML INJ
INTRAVENOUS | Status: AC
  Filled 2023-07-29: qty 10

## 2023-07-29 MED ORDER — DEXAMETHASONE SODIUM PHOSPHATE 4 MG/ML IJ SOLN
INTRAMUSCULAR | Status: DC | PRN
  Administered 2023-07-29: 10 mg via INTRAVENOUS

## 2023-07-29 MED ORDER — LIDOCAINE 2% (20 MG/ML) 5 ML SYRINGE
INTRAMUSCULAR | Status: AC
  Filled 2023-07-29: qty 15

## 2023-07-29 MED ORDER — OXYCODONE HCL 5 MG PO TABS
ORAL_TABLET | ORAL | Status: AC
  Filled 2023-07-29: qty 1

## 2023-07-29 MED ORDER — OXYCODONE HCL 5 MG PO TABS
5.0000 mg | ORAL_TABLET | Freq: Once | ORAL | Status: AC | PRN
  Administered 2023-07-29: 5 mg via ORAL

## 2023-07-29 MED ORDER — ONDANSETRON HCL 4 MG/2ML IJ SOLN: 4.0000 mg | Freq: Once | INTRAMUSCULAR | Status: DC | PRN

## 2023-07-29 MED ORDER — OXYCODONE HCL 5 MG/5ML PO SOLN: 5.0000 mg | Freq: Once | ORAL | Status: AC | PRN

## 2023-07-29 SURGICAL SUPPLY — 57 items
BANDAGE ESMARK 6X9 LF (GAUZE/BANDAGES/DRESSINGS) ×1 IMPLANT
BLADE SURG 15 STRL LF DISP TIS (BLADE) ×4 IMPLANT
BNDG COHESIVE 4X5 TAN STRL LF (GAUZE/BANDAGES/DRESSINGS) ×1 IMPLANT
BNDG ELASTIC 4INX 5YD STR LF (GAUZE/BANDAGES/DRESSINGS) ×1 IMPLANT
BNDG ELASTIC 6INX 5YD STR LF (GAUZE/BANDAGES/DRESSINGS) IMPLANT
BNDG ESMARK 6X9 LF (GAUZE/BANDAGES/DRESSINGS) IMPLANT
BNDG GAUZE DERMACEA FLUFF 4 (GAUZE/BANDAGES/DRESSINGS) ×1 IMPLANT
BRUSH SCRUB EZ 4% CHG (MISCELLANEOUS) ×1 IMPLANT
CANISTER SUCT 1200ML W/VALVE (MISCELLANEOUS) ×1 IMPLANT
CHLORAPREP W/TINT 26 (MISCELLANEOUS) ×2 IMPLANT
COVER BACK TABLE 60X90IN (DRAPES) ×1 IMPLANT
CUFF TRNQT CYL 34X4.125X (TOURNIQUET CUFF) IMPLANT
DRAPE C-ARM 42X72 X-RAY (DRAPES) IMPLANT
DRAPE C-ARMOR (DRAPES) IMPLANT
DRAPE EXTREMITY T 121X128X90 (DISPOSABLE) ×1 IMPLANT
DRAPE IMP U-DRAPE 54X76 (DRAPES) ×1 IMPLANT
DRAPE OEC MINIVIEW 54X84 (DRAPES) IMPLANT
DRAPE U-SHAPE 47X51 STRL (DRAPES) ×1 IMPLANT
DRSG MEPITEL 4X7.2 (GAUZE/BANDAGES/DRESSINGS) ×1 IMPLANT
ELECT REM PT RETURN 9FT ADLT (ELECTROSURGICAL) IMPLANT
ELECTRODE REM PT RTRN 9FT ADLT (ELECTROSURGICAL) ×1 IMPLANT
GAUZE PAD ABD 8X10 STRL (GAUZE/BANDAGES/DRESSINGS) IMPLANT
GAUZE SPONGE 4X4 12PLY STRL (GAUZE/BANDAGES/DRESSINGS) ×1 IMPLANT
GLOVE BIOGEL PI IND STRL 8 (GLOVE) ×1 IMPLANT
GLOVE SURG SS PI 7.5 STRL IVOR (GLOVE) ×2 IMPLANT
GOWN STRL REUS W/ TWL LRG LVL3 (GOWN DISPOSABLE) ×2 IMPLANT
MARKER SKIN DUAL TIP RULER LAB (MISCELLANEOUS) IMPLANT
NDL HYPO 22X1.5 SAFETY MO (MISCELLANEOUS) IMPLANT
NDL HYPO 25X1 1.5 SAFETY (NEEDLE) IMPLANT
NEEDLE HYPO 22X1.5 SAFETY MO (MISCELLANEOUS) ×1 IMPLANT
NEEDLE HYPO 25X1 1.5 SAFETY (NEEDLE) IMPLANT
NS IRRIG 1000ML POUR BTL (IV SOLUTION) ×1 IMPLANT
PACK BASIN DAY SURGERY FS (CUSTOM PROCEDURE TRAY) ×1 IMPLANT
PAD CAST 4YDX4 CTTN HI CHSV (CAST SUPPLIES) ×1 IMPLANT
PADDING CAST ABS COTTON 4X4 ST (CAST SUPPLIES) IMPLANT
PADDING CAST COTTON 6X4 STRL (CAST SUPPLIES) ×1 IMPLANT
PADDING CAST SYNTHETIC 4X4 STR (CAST SUPPLIES) IMPLANT
PENCIL SMOKE EVACUATOR (MISCELLANEOUS) ×1 IMPLANT
SHEET MEDIUM DRAPE 40X70 STRL (DRAPES) ×1 IMPLANT
SLEEVE SCD COMPRESS KNEE MED (STOCKING) ×1 IMPLANT
SPIKE FLUID TRANSFER (MISCELLANEOUS) IMPLANT
SPLINT PLASTER CAST FAST 5X30 (CAST SUPPLIES) IMPLANT
SPONGE T-LAP 18X18 ~~LOC~~+RFID (SPONGE) ×1 IMPLANT
STAPLER SKIN PROX 35W (STAPLE) IMPLANT
STOCKINETTE 6 STRL (DRAPES) ×1 IMPLANT
STOCKINETTE ORTHO 6X25 (MISCELLANEOUS) ×1 IMPLANT
SUCTION TUBE FRAZIER 10FR DISP (SUCTIONS) IMPLANT
SUT ETHILON 2 0 FS 18 (SUTURE) ×2 IMPLANT
SUT MNCRL AB 3-0 PS2 18 (SUTURE) ×1 IMPLANT
SUT VIC AB 2-0 SH 27XBRD (SUTURE) ×1 IMPLANT
SUT VIC AB 3-0 SH 27X BRD (SUTURE) IMPLANT
SUT VICRYL 0 SH 27 (SUTURE) IMPLANT
SYR BULB IRRIG 60ML STRL (SYRINGE) ×1 IMPLANT
SYR CONTROL 10ML LL (SYRINGE) IMPLANT
TOWEL GREEN STERILE FF (TOWEL DISPOSABLE) ×2 IMPLANT
TUBE CONNECTING 20X1/4 (TUBING) IMPLANT
UNDERPAD 30X36 HEAVY ABSORB (UNDERPADS AND DIAPERS) ×1 IMPLANT

## 2023-07-29 NOTE — H&P (Signed)
 H&P Update:  -History and Physical Reviewed  -Patient has been re-examined  -No change in the plan of care  -The risks and benefits were presented and reviewed. The risks due to inability to remove part/all of hardware, recurrent instability, hardware/suture failure and/or irritation, new/persistent infection, stiffness, nerve/vessel/tendon injury or rerupture of repaired tendon, nonunion/malunion, allograft usage, wound healing issues, development of arthritis, failure of this surgery, possibility of external fixation with delayed definitive surgery, need for further surgery, thromboembolic events, anesthesia/medical complications, amputation, death among others were discussed. The patient acknowledged the explanation, agreed to proceed with the plan and a consent was signed.  Kayla Haley

## 2023-07-29 NOTE — Anesthesia Postprocedure Evaluation (Signed)
 Anesthesia Post Note  Patient: Kayla Haley  Procedure(s) Performed: REMOVAL, HARDWARE (Left: Ankle)     Patient location during evaluation: PACU Anesthesia Type: General Level of consciousness: awake and alert and oriented Pain management: pain level controlled Vital Signs Assessment: post-procedure vital signs reviewed and stable Respiratory status: spontaneous breathing, nonlabored ventilation and respiratory function stable Cardiovascular status: blood pressure returned to baseline and stable Postop Assessment: no apparent nausea or vomiting Anesthetic complications: no   No notable events documented.  Last Vitals:  Vitals:   07/29/23 1100 07/29/23 1115  BP: (!) 89/53 94/64  Pulse: 76 66  Resp: 18 18  Temp:    SpO2: 97% 99%    Last Pain:  Vitals:   07/29/23 1100  TempSrc:   PainSc: 6                  Niketa Turner A.

## 2023-07-29 NOTE — Transfer of Care (Signed)
 Immediate Anesthesia Transfer of Care Note  Patient: Kayla Haley  Procedure(s) Performed: REMOVAL, HARDWARE (Left: Ankle)  Patient Location: PACU  Anesthesia Type:General  Level of Consciousness: awake and patient cooperative  Airway & Oxygen Therapy: Patient Spontanous Breathing  Post-op Assessment: Report given to RN and Post -op Vital signs reviewed and stable  Post vital signs: Reviewed and stable  Last Vitals:  Vitals Value Taken Time  BP    Temp    Pulse 79 07/29/23 1050  Resp 11 07/29/23 1050  SpO2 100 % 07/29/23 1050  Vitals shown include unfiled device data.  Last Pain:  Vitals:   07/29/23 0921  TempSrc: Temporal  PainSc: 0-No pain      Patients Stated Pain Goal: 3 (07/29/23 0921)  Complications: No notable events documented.

## 2023-07-29 NOTE — Anesthesia Procedure Notes (Signed)
 Procedure Name: LMA Insertion Date/Time: 07/29/2023 10:23 AM  Performed by: Lonia Ro, CRNAPre-anesthesia Checklist: Patient identified, Emergency Drugs available, Suction available, Patient being monitored and Timeout performed Patient Re-evaluated:Patient Re-evaluated prior to induction Oxygen Delivery Method: Circle system utilized Preoxygenation: Pre-oxygenation with 100% oxygen Induction Type: IV induction Ventilation: Mask ventilation without difficulty LMA: LMA inserted LMA Size: 4.0 Number of attempts: 1 Placement Confirmation: positive ETCO2 and breath sounds checked- equal and bilateral Tube secured with: Tape Dental Injury: Teeth and Oropharynx as per pre-operative assessment

## 2023-07-29 NOTE — Op Note (Signed)
 07/29/2023  10:55 AM   PATIENT: Kayla Haley  23 y.o. female  MRN: [id number]   PRE-OPERATIVE DIAGNOSIS:   Hardware left ankle s/p ORIF   POST-OPERATIVE DIAGNOSIS:   Same   PROCEDURE: Left ankle removal of deep hardware (syndesmosis fixation)   SURGEON:  Ali Ink, MD   ASSISTANT: None   ANESTHESIA: General, regional   EBL: Minimal   TOURNIQUET:   None   COMPLICATIONS: None apparent   DISPOSITION: Extubated, awake and stable to recovery.   INDICATION FOR PROCEDURE: The patient presented with above diagnosis.  We discussed the diagnosis, alternative treatment options, risks and benefits of the above surgical intervention, as well as alternative non-operative treatments. All questions/concerns were addressed and the patient/family demonstrated appropriate understanding of the diagnosis, the procedure, the postoperative course, and overall prognosis. The patient wished to proceed with surgical intervention and signed an informed surgical consent as such, in each others presence prior to surgery.   PROCEDURE IN DETAIL: After preoperative consent was obtained and the correct operative site was identified, the patient was brought to the operating room supine on stretcher and transferred onto operating table. General anesthesia was induced. Preoperative antibiotics were administered. Surgical timeout was taken. The patient was then positioned supine with an ipsilateral hip bump. The operative lower extremity was prepped and draped in standard sterile fashion.  Prior lateralapproach was utilized over the distal fibula and dissection carried down to the level of plate. The syndesmosis screw was removed completely and stability of the anklewas noted to clinical and fluoroscopic testing.    The surgical sites were thoroughly irrigated. The tourniquet was deflated and hemostasis achieved. Betadine solution was used to irrigate and vancomycin powder applied. The skin  was closed without tension.    The leg was cleaned with saline and sterile mepitel dressings with gauze were applied. A well padded sterile wrap was applied. The patient was awakened from anesthesia and transported to the recovery room in stable condition.    FOLLOW UP PLAN: -transfer to PACU, then home -strict NWB operative extremity until nerve block wears off, then OK to WBAT, maximum elevation -maintain dressings until follow up -DVT ppx: Aspirin 81 mg twice daily while NWB -follow up as outpatient within 7-10 days for wound check -sutures out in 2-3 weeks in outpatient office   RADIOGRAPHS: AP, lateral, oblique and stress radiographs of the operative ankle were obtained intraoperatively. These showed interval removal of the syndesmosis screws. Manual stress radiographs were taken and the joints were noted to be stable following hardware removal. No other acute injuries are noted.  Ali Ink Orthopaedic Surgery [facility]

## 2023-07-30 ENCOUNTER — Encounter (HOSPITAL_BASED_OUTPATIENT_CLINIC_OR_DEPARTMENT_OTHER): Payer: Self-pay | Admitting: Orthopaedic Surgery

## 2023-10-05 ENCOUNTER — Encounter: Payer: Self-pay | Admitting: Family

## 2023-10-05 ENCOUNTER — Other Ambulatory Visit (HOSPITAL_COMMUNITY)
Admission: RE | Admit: 2023-10-05 | Discharge: 2023-10-05 | Disposition: A | Discharge status: Home / Self Care | Source: Ambulatory Visit | Attending: Family | Admitting: Family

## 2023-10-05 ENCOUNTER — Ambulatory Visit (INDEPENDENT_AMBULATORY_CARE_PROVIDER_SITE_OTHER): Admitting: Family

## 2023-10-05 VITALS — BP 120/78 | HR 84 | Temp 98.1°F | Ht 61.0 in | Wt 127.0 lb

## 2023-10-05 DIAGNOSIS — Z113 Encounter for screening for infections with a predominantly sexual mode of transmission: Secondary | ICD-10-CM | POA: Diagnosis present

## 2023-10-05 DIAGNOSIS — N920 Excessive and frequent menstruation with regular cycle: Secondary | ICD-10-CM | POA: Diagnosis not present

## 2023-10-05 MED ORDER — IBUPROFEN 800 MG PO TABS: 800.0000 mg | ORAL_TABLET | Freq: Three times a day (TID) | ORAL | 0 refills | Status: DC | PRN

## 2023-10-05 MED ORDER — IBUPROFEN 800 MG PO TABS: 800.0000 mg | ORAL_TABLET | Freq: Three times a day (TID) | ORAL | 2 refills | Status: DC | PRN

## 2023-10-05 NOTE — Progress Notes (Signed)
 Patient ID: Kayla Haley, female    DOB: Oct 22, 2000, 23 y.o.   MRN: [id number]  Chief Complaint  Patient presents with   Follow-up    Want to discuss menstrual cycle  Discussed the use of AI scribe software for clinical note transcription with the patient, who gave verbal consent to proceed.  History of Present Illness Kayla Haley is a 23 year old female with a history of anemia who presents with heavy menstrual bleeding and severe dysmenorrhea.  Menstrual bleeding lasts approximately six days, with the first five days being particularly heavy. She uses both pads and tampons, often changing them frequently, and sometimes uses 'pad diapers' at night due to the volume of bleeding. Large blood clots are present during her periods. Severe dysmenorrhea includes significant low back pain and lower abdominal cramps. Pain requires the use of a heating pad for relief and persists until around the fifth day of her cycle, although the back pain continues throughout the duration. Ibuprofen  is used occasionally for pain management but is not always effective. She takes iron supplements for anemia, which she attributes to heavy menstrual bleeding. She has not used hormonal treatments such as birth control or Depo-Provera. There is a family history of endometriosis in a cousin. No symptoms suggestive of a bladder infection or UTI. No current cravings for ice.  Assessment & Plan Menorrhagia with possible endometriosis Heavy menstrual bleeding with severe pain and large clots. Family history suggests endometriosis. Differential includes endometriosis and uterine fibroids. Currently taking Ferrous Sulfate  qd. Anemia due to bleeding. Discussed hormonal treatments and diagnostic tools. - Refer to gynecology for evaluation and possible transvaginal ultrasound and endometrial biopsy. - Prescribe ibuprofen  800 mg up to three times daily with food for pain and bleeding. - Discuss potential hormonal treatments,  including birth control and Depo-Provera, pending gynecological evaluation.  Anemia secondary to menorrhagia Anemia due to heavy menstrual bleeding. On iron supplements. - Continue iron supplementation. - Recheck CBC in 1-2 mos  General Health Maintenance/STI testing - Perform a swab test for sexually transmitted infections.      Subjective:    Outpatient Medications Prior to Visit  Medication Sig Dispense Refill   ferrous sulfate  325 (65 FE) MG EC tablet Take 1 tablet (325 mg total) by mouth daily with breakfast. 90 tablet 3   No facility-administered medications prior to visit.   Past Medical History:  Diagnosis Date   Closed left ankle fracture 02/28/2023   ED visit in epic;  s/p closed reduction w/ sedation   History of chlamydia 12/2022   treated   Iron deficiency anemia due to chronic blood loss    Menorrhagia with regular cycle    Past Surgical History:  Procedure Laterality Date   HARDWARE REMOVAL Left 07/29/2023   Procedure: REMOVAL, HARDWARE;  Surgeon: Barton Drape, MD;  Location: [facility];  Service: Orthopedics;  Laterality: Left;  Left ankle removal of deep hardware (syndesmosis fixation)   ORIF ANKLE FRACTURE Left 03/23/2023   Procedure: OPEN REDUCTION INTERNAL FIXATION (ORIF) BIMALLEOLAR ANKLE FRACTURE;  Surgeon: Barton Drape, MD;  Location: [facility] [place];  Service: Orthopedics;  Laterality: Left;   SYNDESMOSIS REPAIR Left 03/23/2023   Procedure: SYNDESMOSIS REPAIR;  Surgeon: Barton Drape, MD;  Location: [facility] ;  Service: Orthopedics;  Laterality: Left;   TONSILLECTOMY AND ADENOIDECTOMY  12/13/2003   @ [facility] by dr j. byers   WISDOM TOOTH EXTRACTION Bilateral 2020   No Known Allergies    Objective:  Physical Exam Vitals and nursing note reviewed.  Constitutional:      Appearance: Normal appearance.   Cardiovascular:     Rate and Rhythm: Normal rate and regular rhythm.  Pulmonary:      Effort: Pulmonary effort is normal.     Breath sounds: Normal breath sounds.   Musculoskeletal:        General: Normal range of motion.   Skin:    General: Skin is warm and dry.   Neurological:     Mental Status: She is alert.   Psychiatric:        Mood and Affect: Mood normal.        Behavior: Behavior normal.    BP 120/78   Pulse 84   Temp 98.1 F (36.7 C)   Ht 5' 1 (1.549 m)   Wt 127 lb (57.6 kg)   LMP 09/07/2023 (Approximate)   SpO2 99%   BMI 24.00 kg/m  Wt Readings from Last 3 Encounters:  10/05/23 127 lb (57.6 kg)  07/29/23 125 lb (56.7 kg)  07/22/23 129 lb 3.2 oz (58.6 kg)      Lucius Krabbe, NP

## 2023-10-08 ENCOUNTER — Ambulatory Visit: Payer: Self-pay | Admitting: Family

## 2023-10-08 DIAGNOSIS — A749 Chlamydial infection, unspecified: Secondary | ICD-10-CM

## 2023-10-08 DIAGNOSIS — B9689 Other specified bacterial agents as the cause of diseases classified elsewhere: Secondary | ICD-10-CM

## 2023-10-08 LAB — CERVICOVAGINAL ANCILLARY ONLY
Bacterial Vaginitis (gardnerella): POSITIVE — AB
Chlamydia: POSITIVE — AB
Comment: NEGATIVE
Comment: NEGATIVE
Comment: NEGATIVE
Comment: NEGATIVE
Comment: NEGATIVE
Comment: NORMAL
Neisseria Gonorrhea: NEGATIVE

## 2023-10-08 MED ORDER — DOXYCYCLINE HYCLATE 100 MG PO TABS: 100.0000 mg | ORAL_TABLET | Freq: Two times a day (BID) | ORAL | 0 refills | Status: AC

## 2023-10-08 MED ORDER — METRONIDAZOLE 500 MG PO TABS: 500.0000 mg | ORAL_TABLET | Freq: Two times a day (BID) | ORAL | 0 refills | Status: AC

## 2023-10-08 NOTE — Progress Notes (Signed)
 Inform health dept about chlamydia dx & call patient if has not read lab message by end of day, thanks.

## 2023-10-09 NOTE — Telephone Encounter (Signed)
 Please see pt comment on lab results, fax sent to [facility] to report

## 2023-10-09 NOTE — Telephone Encounter (Signed)
 Unfortunately no way of knowing how long - could have been months. The pills are more effective for the BV, but like I said in the message, depends on if she is having symptoms or not. If having symptoms I can send the gel to use now and if no sx, then can wait & take the pills after the DOXY,

## 2023-10-15 ENCOUNTER — Ambulatory Visit: Admitting: Family

## 2023-10-19 ENCOUNTER — Ambulatory Visit (INDEPENDENT_AMBULATORY_CARE_PROVIDER_SITE_OTHER): Admitting: Family

## 2023-10-19 VITALS — BP 112/64 | HR 74 | Temp 98.4°F | Ht 61.0 in | Wt 131.8 lb

## 2023-10-19 DIAGNOSIS — Z3200 Encounter for pregnancy test, result unknown: Secondary | ICD-10-CM

## 2023-10-19 NOTE — Progress Notes (Signed)
 Patient ID: Kayla Haley, female    DOB: 20-Apr-2000, 23 y.o.   MRN: [id number]  Chief Complaint  Patient presents with   Possible Pregnancy    Wants blood work, and wants to discuss next steps with you.   Discussed the use of AI scribe software for clinical note transcription with the patient, who gave verbal consent to proceed.  History of Present Illness Kayla Haley is a 23 year old female who presents with a positive home pregnancy test.  Positive pregnancy test - Two positive home pregnancy tests - Last menstrual period began on Sep 09, 2023 - Uncertain timing of conception - Sexual intercourse on June 6-7 and September 26, 2023  Early pregnancy symptoms - Nausea - Breast tenderness  Sexual history and contraceptive use - More than one sexual partner - Uncertain about protection use during intercourse on September 19, 2023, due to intoxication  Assessment & Plan First trimester Pregnancy Approximately four weeks pregnant based on LMP and sexual activity. Two positive home pregnancy tests. Experiencing nausea and breast tenderness. Uncertain about continuing pregnancy. Discussed importance of timely decision if considering termination. Provided anticipatory guidance on medication and substance avoidance. Discussed serum pregnancy test for confirmation and possible gestational age estimation. - Order serum pregnancy test to confirm pregnancy and estimate possible gestational age. - Advise avoidance of ibuprofen , naproxen , cat feces, raw food, alcohol, cigarettes, and marijuana. - Advise acetaminophen  for pain if needed. - Handout provided with above info. - Discuss referral to OB for prenatal care if continuing pregnancy - referral already sent for irregular cycles 2 weeks ago, phone # provided to call. - Discuss contacting [facility] for termination options if not continuing pregnancy.  Follow-up Existing OB appointment on June 29. Emphasized importance of Social research officer, government.  Referral previously sent for irregular cycles. - Confirm OB appointment and insurance acceptance.   Subjective:    Outpatient Medications Prior to Visit  Medication Sig Dispense Refill   ferrous sulfate  325 (65 FE) MG EC tablet Take 1 tablet (325 mg total) by mouth daily with breakfast. (Patient not taking: Reported on 10/19/2023) 90 tablet 3   ibuprofen  (ADVIL ) 800 MG tablet Take 1 tablet (800 mg total) by mouth every 8 (eight) hours as needed for moderate pain (pain score 4-6) or cramping. Take after eating. (Patient not taking: Reported on 10/19/2023) 30 tablet 2   No facility-administered medications prior to visit.   Past Medical History:  Diagnosis Date   Closed left ankle fracture 02/28/2023   ED visit in epic;  s/p closed reduction w/ sedation   History of chlamydia 12/2022   treated   Iron deficiency anemia due to chronic blood loss    Menorrhagia with regular cycle    Past Surgical History:  Procedure Laterality Date   HARDWARE REMOVAL Left 07/29/2023   Procedure: REMOVAL, HARDWARE;  Surgeon: Barton Drape, MD;  Location: [facility];  Service: Orthopedics;  Laterality: Left;  Left ankle removal of deep hardware (syndesmosis fixation)   ORIF ANKLE FRACTURE Left 03/23/2023   Procedure: OPEN REDUCTION INTERNAL FIXATION (ORIF) BIMALLEOLAR ANKLE FRACTURE;  Surgeon: Barton Drape, MD;  Location: [facility];  Service: Orthopedics;  Laterality: Left;   SYNDESMOSIS REPAIR Left 03/23/2023   Procedure: SYNDESMOSIS REPAIR;  Surgeon: Barton Drape, MD;  Location: [place] ;  Service: Orthopedics;  Laterality: Left;   TONSILLECTOMY AND ADENOIDECTOMY  12/13/2003   @ [facility] by dr j. byers   WISDOM TOOTH EXTRACTION Bilateral 2020   No  Known Allergies    Objective:    Physical Exam Vitals and nursing note reviewed.  Constitutional:      Appearance: Normal appearance.  Cardiovascular:     Rate and Rhythm: Normal rate and regular  rhythm.  Pulmonary:     Effort: Pulmonary effort is normal.     Breath sounds: Normal breath sounds.  Musculoskeletal:        General: Normal range of motion.  Skin:    General: Skin is warm and dry.  Neurological:     Mental Status: She is alert.  Psychiatric:        Mood and Affect: Mood normal.        Behavior: Behavior normal.    BP 112/64   Pulse 74   Temp 98.4 F (36.9 C) (Temporal)   Ht 5' 1 (1.549 m)   Wt 131 lb 12.8 oz (59.8 kg)   LMP 09/09/2023   SpO2 99%   BMI 24.90 kg/m  Wt Readings from Last 3 Encounters:  10/19/23 131 lb 12.8 oz (59.8 kg)  10/05/23 127 lb (57.6 kg)  07/29/23 125 lb (56.7 kg)      Lucius Krabbe, NP

## 2023-10-20 ENCOUNTER — Ambulatory Visit: Payer: Self-pay | Admitting: Family

## 2023-10-20 LAB — HCG, QUANTITATIVE, PREGNANCY: Quantitative HCG: 37813 m[IU]/mL

## 2024-01-18 ENCOUNTER — Ambulatory Visit (INDEPENDENT_AMBULATORY_CARE_PROVIDER_SITE_OTHER): Admitting: Family

## 2024-01-18 ENCOUNTER — Other Ambulatory Visit (HOSPITAL_COMMUNITY)
Admission: RE | Admit: 2024-01-18 | Discharge: 2024-01-18 | Disposition: A | Source: Ambulatory Visit | Attending: Family | Admitting: Family

## 2024-01-18 ENCOUNTER — Encounter: Payer: Self-pay | Admitting: Family

## 2024-01-18 VITALS — BP 100/78 | HR 68 | Temp 98.6°F | Ht 61.0 in | Wt 135.2 lb

## 2024-01-18 DIAGNOSIS — N898 Other specified noninflammatory disorders of vagina: Secondary | ICD-10-CM | POA: Insufficient documentation

## 2024-01-18 DIAGNOSIS — R21 Rash and other nonspecific skin eruption: Secondary | ICD-10-CM

## 2024-01-18 DIAGNOSIS — Z0000 Encounter for general adult medical examination without abnormal findings: Secondary | ICD-10-CM | POA: Diagnosis present

## 2024-01-18 LAB — CBC WITH DIFFERENTIAL/PLATELET
Basophils Absolute: 0 K/uL (ref 0.0–0.1)
Basophils Relative: 0.5 % (ref 0.0–3.0)
Eosinophils Absolute: 0.1 K/uL (ref 0.0–0.7)
Eosinophils Relative: 2 % (ref 0.0–5.0)
HCT: 34.1 % — ABNORMAL LOW (ref 36.0–46.0)
Hemoglobin: 10.9 g/dL — ABNORMAL LOW (ref 12.0–15.0)
Lymphocytes Relative: 33 % (ref 12.0–46.0)
Lymphs Abs: 1.9 K/uL (ref 0.7–4.0)
MCHC: 32 g/dL (ref 30.0–36.0)
MCV: 86.9 fl (ref 78.0–100.0)
Monocytes Absolute: 0.4 K/uL (ref 0.1–1.0)
Monocytes Relative: 6.9 % (ref 3.0–12.0)
Neutro Abs: 3.3 K/uL (ref 1.4–7.7)
Neutrophils Relative %: 57.6 % (ref 43.0–77.0)
Platelets: 237 K/uL (ref 150.0–400.0)
RBC: 3.92 Mil/uL (ref 3.87–5.11)
RDW: 14.8 % (ref 11.5–15.5)
WBC: 5.8 K/uL (ref 4.0–10.5)

## 2024-01-18 LAB — TSH: TSH: 1.31 u[IU]/mL (ref 0.35–5.50)

## 2024-01-18 MED ORDER — TRIAMCINOLONE ACETONIDE 0.1 % EX CREA: 1.0000 | TOPICAL_CREAM | Freq: Two times a day (BID) | CUTANEOUS | 0 refills | Status: AC

## 2024-01-18 NOTE — Patient Instructions (Addendum)
 It was very nice to see you today!   I will review your lab results via MyChart in a few days. I have sent over a steroid cream to use on your arm rash which looks like eczema. Be sure to use a thick cream all over your skin for prevention (use over the steroid cream).  You look great! Stay well! Exercise more!      PLEASE NOTE:  If you had any lab tests please let us  know if you have not heard back within a few days. You may see your results on MyChart before we have a chance to review them but we will give you a call once they are reviewed by us . If we ordered any referrals today, please let us  know if you have not heard from their office within the next week.

## 2024-01-18 NOTE — Progress Notes (Signed)
 Phone [phone]  Subjective:   Patient is a 23 y.o. female presenting for annual physical.    Chief Complaint  Patient presents with   Annual Exam    Patient stated she had a few other questions.   HPI: Here for CPE and labs. Reports having some vaginal discharge with odor, no urinary sx. Reports also rash on her left arm, elbow and right arm, no redness, pinpoint raised skin colored dots with mild scaling, itchy. She has applied Aquafor which has helped a little.  See problem oriented charting- ROS- full  review of systems was completed and negative except for what is noted in HPI above.  The following were reviewed and entered/updated in epic: Past Medical History:  Diagnosis Date   Closed left ankle fracture 02/28/2023   ED visit in epic;  s/p closed reduction w/ sedation   History of chlamydia 12/2022   treated   Iron deficiency anemia due to chronic blood loss    Menorrhagia with regular cycle    Patient Active Problem List   Diagnosis Date Noted   Iron deficiency anemia due to chronic blood loss 07/22/2023   Status post ORIF of fracture of ankle 04/09/2023   Menorrhagia with regular cycle 01/05/2023   Past Surgical History:  Procedure Laterality Date   HARDWARE REMOVAL Left 07/29/2023   Procedure: REMOVAL, HARDWARE;  Surgeon: Barton Drape, MD;  [facility];  Service: Orthopedics;  Laterality: Left;  Left ankle removal of deep hardware (syndesmosis fixation)   ORIF ANKLE FRACTURE Left 03/23/2023   Procedure: OPEN REDUCTION INTERNAL FIXATION (ORIF) BIMALLEOLAR ANKLE FRACTURE;  Surgeon: Barton Drape, MD;  Location: [facility] [place];  Service: Orthopedics;  Laterality: Left;   SYNDESMOSIS REPAIR Left 03/23/2023   Procedure: SYNDESMOSIS REPAIR;  Surgeon: Barton Drape, MD;  Location: [facility] [place];  Service: Orthopedics;  Laterality: Left;   TONSILLECTOMY AND ADENOIDECTOMY  12/13/2003   @ [facility] by dr j. byers    WISDOM TOOTH EXTRACTION Bilateral 2020    Family History  Problem Relation Age of Onset   Stroke Paternal Grandmother     Medications- reviewed and updated Current Outpatient Medications  Medication Sig Dispense Refill   triamcinolone cream (KENALOG) 0.1 % Apply 1 Application topically 2 (two) times daily. 30 g 0   No current facility-administered medications for this visit.    Allergies-reviewed and updated No Known Allergies  Social History   Social History Narrative   Not on file    Objective:  BP 100/78   Pulse 68   Temp 98.6 F (37 C) (Oral)   Ht 5' 1 (1.549 m)   Wt 135 lb 3.2 oz (61.3 kg)   SpO2 98%   BMI 25.55 kg/m  Physical Exam Vitals and nursing note reviewed. Exam conducted with a chaperone present.  Constitutional:      Appearance: Normal appearance.  HENT:     Head: Normocephalic.     Right Ear: Tympanic membrane normal.     Left Ear: Tympanic membrane normal.     Nose: Nose normal.     Mouth/Throat:     Mouth: Mucous membranes are moist.  Eyes:     Pupils: Pupils are equal, round, and reactive to light.  Cardiovascular:     Rate and Rhythm: Normal rate and regular rhythm.  Pulmonary:     Effort: Pulmonary effort is normal.     Breath sounds: Normal breath sounds.  Genitourinary:    Exam position: Lithotomy position.  Pubic Area: No rash.      Labia:        Right: No tenderness or lesion.        Left: No tenderness or lesion.      Vagina: Vaginal discharge (light yellow, thick) present.     Cervix: Friability present.     Comments: PAP smear specimen obtained for testing. Musculoskeletal:        General: Normal range of motion.     Cervical back: Normal range of motion.  Lymphadenopathy:     Cervical: No cervical adenopathy.  Skin:    General: Skin is warm and dry.  Neurological:     Mental Status: She is alert.  Psychiatric:        Mood and Affect: Mood normal.        Behavior: Behavior normal.      Assessment and Plan    Health Maintenance counseling: 1. Anticipatory guidance: Patient counseled regarding regular dental exams q6 months, eye exams,  avoiding smoking and second hand smoke, limiting alcohol to 1 beverage per day, no illicit drugs.   2. Risk factor reduction:  Advised patient of need for regular exercise and diet rich with fruits and vegetables to reduce risk of heart attack and stroke. Wt Readings from Last 3 Encounters:  01/18/24 135 lb 3.2 oz (61.3 kg)  10/19/23 131 lb 12.8 oz (59.8 kg)  10/05/23 127 lb (57.6 kg)   3. Immunizations/screenings/ancillary studies Immunization History  Administered Date(s) Administered   DTaP 11/09/2000, 01/11/2001, 03/02/2001, 02/18/2002   Dtap, Unspecified 09/19/2004   HIB, Unspecified 11/09/2000, 01/11/2001, 03/02/2001, 10/19/2001   HPV Quadrivalent 10/27/2011, 12/30/2011, 05/06/2012   Hep A, Unspecified 10/21/2005   Hep B, Unspecified 09/29/2000, 11/09/2000, 03/02/2001   Hepatitis A, Ped/Adol-2 Dose 11/03/2006   IPV 11/09/2000, 01/11/2001, 10/19/2001   Influenza-Unspecified 01/21/2002, 02/18/2002, 03/23/2002, 05/13/2005   MMR 10/19/2001, 09/19/2004   Meningococcal B Recombinant 09/15/2016, 10/20/2017   Meningococcal Conjugate 10/27/2011, 09/15/2016   Pneumococcal Conjugate PCV 7 11/09/2000, 01/11/2001, 06/02/2001, 10/19/2001   Polio, Unspecified 09/19/2004   Tdap 10/27/2011   Varicella 02/18/2002, 10/21/2005   Health Maintenance Due  Topic Date Due   COVID-19 Vaccine (1 - 2024-25 season) Never done    4. Cervical cancer screening: never done 5. Skin cancer screening- advised regular sunscreen use. Denies worrisome, changing, or new skin lesions.  6. Birth control/STD check: none  7. Smoking associated screening: non- smoker; vaping with nicotine 8. Alcohol screening: about every other week 9. Exercise: none   1. Annual physical exam (Primary)  - TSH - Lipid panel - CBC with Differential/Platelet - Comprehensive metabolic panel with  GFR - Cytology - PAP  2. Vaginal discharge  - Cervicovaginal ancillary only  3. Skin rash  - triamcinolone cream (KENALOG) 0.1 %; Apply 1 Application topically 2 (two) times daily.  Dispense: 30 g; Refill: 0  Recommended follow up:  Return for any future concerns, Complete physical w/fasting labs. No future appointments.  Lab/Order associations: fasting   Lucius Krabbe, NP

## 2024-01-19 LAB — COMPREHENSIVE METABOLIC PANEL WITH GFR
ALT: 13 U/L (ref 0–35)
AST: 19 U/L (ref 0–37)
Albumin: 4.5 g/dL (ref 3.5–5.2)
Alkaline Phosphatase: 47 U/L (ref 39–117)
BUN: 17 mg/dL (ref 6–23)
CO2: 26 meq/L (ref 19–32)
Calcium: 9.3 mg/dL (ref 8.4–10.5)
Chloride: 105 meq/L (ref 96–112)
Creatinine, Ser: 0.62 mg/dL (ref 0.40–1.20)
GFR: 125.55 mL/min (ref >60.00)
Glucose, Bld: 81 mg/dL (ref 70–99)
Potassium: 3.9 meq/L (ref 3.5–5.1)
Sodium: 140 meq/L (ref 135–145)
Total Bilirubin: 0.5 mg/dL (ref 0.2–1.2)
Total Protein: 7.2 g/dL (ref 6.0–8.3)

## 2024-01-19 LAB — CERVICOVAGINAL ANCILLARY ONLY
Bacterial Vaginitis (gardnerella): POSITIVE — AB
Candida Glabrata: NEGATIVE
Candida Vaginitis: NEGATIVE
Comment: NEGATIVE
Comment: NEGATIVE
Comment: NEGATIVE

## 2024-01-19 LAB — LIPID PANEL
Cholesterol: 161 mg/dL (ref 0–200)
HDL: 93.3 mg/dL (ref >39.00)
LDL Cholesterol: 60 mg/dL (ref 0–99)
NonHDL: 67.41
Total CHOL/HDL Ratio: 2
Triglycerides: 35 mg/dL (ref 0.0–149.0)
VLDL: 7 mg/dL (ref 0.0–40.0)

## 2024-01-20 LAB — CYTOLOGY - PAP
Chlamydia: NEGATIVE
Comment: NEGATIVE
Comment: NEGATIVE
Comment: NORMAL
Neisseria Gonorrhea: NEGATIVE
Trichomonas: NEGATIVE

## 2024-01-21 ENCOUNTER — Ambulatory Visit: Payer: Self-pay | Admitting: Family

## 2024-01-21 DIAGNOSIS — R87612 Low grade squamous intraepithelial lesion on cytologic smear of cervix (LGSIL): Secondary | ICD-10-CM

## 2024-01-21 DIAGNOSIS — B9689 Other specified bacterial agents as the cause of diseases classified elsewhere: Secondary | ICD-10-CM

## 2024-01-21 MED ORDER — METRONIDAZOLE 500 MG PO TABS: 500.0000 mg | ORAL_TABLET | Freq: Three times a day (TID) | ORAL | 0 refills | Status: AC

## 2024-03-22 ENCOUNTER — Encounter: Admitting: Obstetrics & Gynecology

## 2024-04-20 NOTE — Progress Notes (Signed)
 "  NEW GYNECOLOGY PATIENT Patient name: Kayla Haley MRN [id number]  Date of birth: 08/08/2000 Chief Complaint:   Dysmenorrhea  History:  Discussed the use of AI scribe software for clinical note transcription with the patient, who gave verbal consent to proceed.  History of Present Illness Kayla Haley is a 24 year old female who presents with an abnormal Pap smear and heavy, painful periods.  She presents for evaluation of an abnormal Pap smear with cellular changes. She has never received the Gardasil or HPV vaccine.  She has had heavy, painful menses for the past four to five years. Flow is very heavy, requiring frequent pad changes and diapers at night. She saturates a pad in an hour in the first 2 days, moderate on day 4 and light on day 5.   Cycles typically 28-20 days. Pain starts a couple of days before onset of bleeding and continues during and after her period. Ibuprofen  gives partial relief. She has associated back pain with menses. The pain is described as sharp and radiates to her back. She has not tried other medications or hormonal birth control. She takes iron pills without clear benefit. . She is sexually active with a female partner and uses condoms. She has no dyspareunia, dysuria or dyschezia.  She has no high blood pressure, liver disease, migraines, blood clots, heart attack, breast cancer, or stroke. She notes a 10-pound weight gain over six months without changes in vision, skin or hair texture, bowel habits, or nipple discharge.      Gynecologic History No LMP recorded. Contraception: condoms    Component Value Date/Time   DIAGPAP - Low grade squamous intraepithelial lesion (LSIL) (A) 01/18/2024 1334   ADEQPAP  01/18/2024 1334    Satisfactory for evaluation; transformation zone component PRESENT.     OB History  Gravida Para Term Preterm AB Living  1    1   SAB IAB Ectopic Multiple Live Births   1       # Outcome Date GA Lbr Len/2nd Weight Sex Type Anes PTL  Lv  1 IAB 10/2023           The following portions of the patient's history were reviewed and updated as appropriate: allergies, current medications, past family history, past medical history, past social history, past surgical history and problem list. Health Maintenance  Topic Date Due   DTaP/Tdap/Td vaccine (7 - Td or Tdap) 10/26/2021   COVID-19 Vaccine (1 - 2025-26 season) Never done   Flu Shot  07/12/2024*   Meningitis B Vaccine (1 of 2 - Standard) 10/04/2024*   Chlamydia screening  01/17/2025   Pap Smear  01/18/2027   HPV Vaccine  Completed   Hepatitis C Screening  Completed   HIV Screening  Completed   Pneumococcal Vaccine  Aged Out  *Topic was postponed. The date shown is not the original due date.     Review of Systems Pertinent items noted in HPI and remainder of comprehensive ROS otherwise negative.  Physical Exam:  BP 122/75   Pulse 69   Wt 140 lb (63.5 kg)   LMP 04/06/2024 (Exact Date)   BMI 26.45 kg/m  Physical Exam Vitals and nursing note reviewed.  Constitutional:      Appearance: Normal appearance.  Cardiovascular:     Rate and Rhythm: Normal rate.  Pulmonary:     Effort: Pulmonary effort is normal.     Breath sounds: Normal breath sounds.  Neurological:     General: No focal deficit present.  Mental Status: She is alert and oriented to person, place, and time.  Psychiatric:        Mood and Affect: Mood normal.        Behavior: Behavior normal.        Thought Content: Thought content normal.        Judgment: Judgment normal.      Assessment and Plan:   Assessment & Plan Abnormal Uterine Bleeding with Dysmenorrhea Chronic heavy and painful menstrual bleeding for 4-5 years. Differential includes fibroids, polyps, and endometriosis. Ibuprofen  provides partial relief. No prior hormonal treatment. . - Ordered pelvic ultrasound to evaluate for fibroids, polyps, or other structural causes. - Discussed hormonal and non-hormonal treatment options,  including TXA (Lysteda) and various birth control methods. - Consider naproxen  as an alternative to ibuprofen  for pain management. - Ordered blood work to assess current blood count and rule out other causes of bleeding: normal TSH 01/2024 - Open to treatment options but requires time to decide Abnormal cervical cytology Abnormal Pap smear indicating potential cervical cell changes. Young age may allow for natural cell turnover and resolution. - Scheduled repeat Pap smear in one year (01/2025). - Recommended HPV vaccination if not previously received.  Iron deficiency anemia (suspected) Suspected iron deficiency anemia due to chronic heavy menstrual bleeding. Iron supplements taken but not significantly effective. Blood count has been variable. - Ordered blood work to assess current blood count. - Will consider iron transfusion if blood count remains low.  Tests ordered - US  PELVIC COMPLETE WITH TRANSVAGINAL; Future - CBC - Testosterone ,Free and Total - Hemoglobin A1c - Von Willebrand panel  Follow-up: No follow-ups on file.      Carter Quarry, MD Obstetrician & Gynecologist, [facility] [employer], [facility] "

## 2024-04-21 ENCOUNTER — Ambulatory Visit: Admitting: Obstetrics and Gynecology

## 2024-04-21 ENCOUNTER — Encounter: Payer: Self-pay | Admitting: Obstetrics and Gynecology

## 2024-04-21 ENCOUNTER — Other Ambulatory Visit: Payer: Self-pay

## 2024-04-21 VITALS — BP 122/75 | HR 69 | Wt 140.0 lb

## 2024-04-21 DIAGNOSIS — N946 Dysmenorrhea, unspecified: Secondary | ICD-10-CM

## 2024-04-21 DIAGNOSIS — N920 Excessive and frequent menstruation with regular cycle: Secondary | ICD-10-CM | POA: Diagnosis not present

## 2024-04-21 NOTE — Patient Instructions (Signed)
 Options for managing periods:  - Tranexamic acid (TXA): nonhormonal medication to lighten bleeding - Hormonal medication: contraceptive and non-contraceptive medications to help control your bleeding including IUD, pills, norethindrone, etc  Lab work today and schedule pelvic US  as part of your evaluation of the bleeding.

## 2024-04-23 LAB — VON WILLEBRAND PANEL
Factor VIII Activity: 111 % (ref 56–140)
Von Willebrand Ag: 84 % (ref 50–200)
Von Willebrand Factor: 71 % (ref 50–200)

## 2024-04-23 LAB — CBC
Hematocrit: 38.2 % (ref 34.0–46.6)
Hemoglobin: 11.7 g/dL (ref 11.1–15.9)
MCH: 27.2 pg (ref 26.6–33.0)
MCHC: 30.6 g/dL — ABNORMAL LOW (ref 31.5–35.7)
MCV: 89 fL (ref 79–97)
Platelets: 241 x10E3/uL (ref 150–450)
RBC: 4.3 x10E6/uL (ref 3.77–5.28)
RDW: 13.7 % (ref 11.7–15.4)
WBC: 7.6 x10E3/uL (ref 3.4–10.8)

## 2024-04-23 LAB — TESTOSTERONE,FREE AND TOTAL
Testosterone, Free: 2.3 pg/mL (ref 0.0–4.2)
Testosterone: 52 ng/dL (ref 13–71)

## 2024-04-23 LAB — HEMOGLOBIN A1C
Est. average glucose Bld gHb Est-mCnc: 100 mg/dL
Hgb A1c MFr Bld: 5.1 % (ref 4.8–5.6)

## 2024-04-23 LAB — COAG STUDIES INTERP REPORT

## 2024-04-27 ENCOUNTER — Ambulatory Visit: Payer: Self-pay | Admitting: Obstetrics and Gynecology

## 2024-05-05 ENCOUNTER — Ambulatory Visit (HOSPITAL_COMMUNITY)
Admission: RE | Admit: 2024-05-05 | Discharge: 2024-05-05 | Disposition: A | Discharge status: Home / Self Care | Source: Ambulatory Visit | Attending: Obstetrics and Gynecology | Admitting: Obstetrics and Gynecology

## 2024-05-05 DIAGNOSIS — N920 Excessive and frequent menstruation with regular cycle: Secondary | ICD-10-CM | POA: Insufficient documentation

## 2024-05-05 DIAGNOSIS — N946 Dysmenorrhea, unspecified: Secondary | ICD-10-CM | POA: Diagnosis present

## 2024-05-10 ENCOUNTER — Encounter (HOSPITAL_BASED_OUTPATIENT_CLINIC_OR_DEPARTMENT_OTHER): Payer: Self-pay

## 2024-05-10 ENCOUNTER — Emergency Department (HOSPITAL_BASED_OUTPATIENT_CLINIC_OR_DEPARTMENT_OTHER)

## 2024-05-10 ENCOUNTER — Other Ambulatory Visit: Payer: Self-pay

## 2024-05-10 ENCOUNTER — Emergency Department (HOSPITAL_BASED_OUTPATIENT_CLINIC_OR_DEPARTMENT_OTHER)
Admission: EM | Admit: 2024-05-10 | Discharge: 2024-05-10 | Disposition: A | Discharge status: Home / Self Care | Attending: Emergency Medicine | Admitting: Emergency Medicine

## 2024-05-10 DIAGNOSIS — N390 Urinary tract infection, site not specified: Secondary | ICD-10-CM | POA: Diagnosis not present

## 2024-05-10 DIAGNOSIS — R1030 Lower abdominal pain, unspecified: Secondary | ICD-10-CM | POA: Diagnosis present

## 2024-05-10 DIAGNOSIS — N898 Other specified noninflammatory disorders of vagina: Secondary | ICD-10-CM | POA: Diagnosis not present

## 2024-05-10 DIAGNOSIS — R1024 Suprapubic pain: Secondary | ICD-10-CM

## 2024-05-10 LAB — COMPREHENSIVE METABOLIC PANEL WITH GFR
ALT: 16 U/L (ref 0–44)
AST: 19 U/L (ref 15–41)
Albumin: 4.6 g/dL (ref 3.5–5.0)
Alkaline Phosphatase: 53 U/L (ref 38–126)
Anion gap: 9 (ref 5–15)
BUN: 11 mg/dL (ref 6–20)
CO2: 25 mmol/L (ref 22–32)
Calcium: 9.4 mg/dL (ref 8.9–10.3)
Chloride: 104 mmol/L (ref 98–111)
Creatinine, Ser: 0.64 mg/dL (ref 0.44–1.00)
GFR, Estimated: >60 mL/min
Glucose, Bld: 84 mg/dL (ref 70–99)
Potassium: 3.8 mmol/L (ref 3.5–5.1)
Sodium: 138 mmol/L (ref 135–145)
Total Bilirubin: 0.5 mg/dL (ref 0.0–1.2)
Total Protein: 7.3 g/dL (ref 6.5–8.1)

## 2024-05-10 LAB — CBC WITH DIFFERENTIAL/PLATELET
Abs Immature Granulocytes: 0.02 10*3/uL (ref 0.00–0.07)
Basophils Absolute: 0 10*3/uL (ref 0.0–0.1)
Basophils Relative: 1 %
Eosinophils Absolute: 0.1 10*3/uL (ref 0.0–0.5)
Eosinophils Relative: 2 %
HCT: 34.6 % — ABNORMAL LOW (ref 36.0–46.0)
Hemoglobin: 11 g/dL — ABNORMAL LOW (ref 12.0–15.0)
Immature Granulocytes: 0 %
Lymphocytes Relative: 34 %
Lymphs Abs: 2.3 10*3/uL (ref 0.7–4.0)
MCH: 28.2 pg (ref 26.0–34.0)
MCHC: 31.8 g/dL (ref 30.0–36.0)
MCV: 88.7 fL (ref 80.0–100.0)
Monocytes Absolute: 0.5 10*3/uL (ref 0.1–1.0)
Monocytes Relative: 7 %
Neutro Abs: 3.8 10*3/uL (ref 1.7–7.7)
Neutrophils Relative %: 56 %
Platelets: 254 10*3/uL (ref 150–400)
RBC: 3.9 MIL/uL (ref 3.87–5.11)
RDW: 14.1 % (ref 11.5–15.5)
WBC: 6.8 10*3/uL (ref 4.0–10.5)
nRBC: 0 % (ref 0.0–0.2)

## 2024-05-10 LAB — URINALYSIS, ROUTINE W REFLEX MICROSCOPIC
Bilirubin Urine: NEGATIVE
Glucose, UA: NEGATIVE mg/dL
Hgb urine dipstick: NEGATIVE
Ketones, ur: NEGATIVE mg/dL
Nitrite: NEGATIVE
Protein, ur: NEGATIVE mg/dL
Specific Gravity, Urine: 1.025 (ref 1.005–1.030)
pH: 5.5 (ref 5.0–8.0)

## 2024-05-10 LAB — LIPASE, BLOOD: Lipase: 21 U/L (ref 11–51)

## 2024-05-10 LAB — PREGNANCY, URINE: Preg Test, Ur: NEGATIVE

## 2024-05-10 LAB — URINALYSIS, MICROSCOPIC (REFLEX)

## 2024-05-10 LAB — WET PREP, GENITAL
Clue Cells Wet Prep HPF POC: NONE SEEN
Sperm: NONE SEEN
Trich, Wet Prep: NONE SEEN
WBC, Wet Prep HPF POC: >=10 — AB
Yeast Wet Prep HPF POC: NONE SEEN

## 2024-05-10 MED ORDER — KETOROLAC TROMETHAMINE 15 MG/ML IJ SOLN
15.0000 mg | Freq: Once | INTRAMUSCULAR | Status: AC
  Administered 2024-05-10: 15 mg via INTRAMUSCULAR
  Filled 2024-05-10: qty 1

## 2024-05-10 MED ORDER — IPRATROPIUM-ALBUTEROL 0.5-2.5 (3) MG/3ML IN SOLN: 3.0000 mL | Freq: Once | RESPIRATORY_TRACT | Status: DC

## 2024-05-10 MED ORDER — CEPHALEXIN 500 MG PO CAPS: 500.0000 mg | ORAL_CAPSULE | Freq: Two times a day (BID) | ORAL | 0 refills | Status: AC

## 2024-05-10 NOTE — Discharge Instructions (Signed)
 Begin taking Keflex  as prescribed for UTI.  Stay hydrated and drink plenty of clear fluids.  May take Tylenol  or ibuprofen  every 6 hours as needed for pain.  Results of STI testing will be on MyChart in approximately 48 hours.  You may follow-up at the ED, health department, or primary care for any positive results.  Follow-up with gynecology as scheduled for further manage of irregular menstrual cycles.  Return to ED if any symptoms worsen including severe abdominal pain, new fevers, uncontrollable nausea/vomiting, worsening vaginal discharge.

## 2024-05-10 NOTE — ED Notes (Signed)
 ED Provider at bedside.

## 2024-05-10 NOTE — ED Provider Notes (Signed)
 " [place] [facility] Provider Note   CSN: [id number] Arrival date & time: 05/10/24  1424     Patient presents with: Abdominal Pain   Kayla Haley is a 24 y.o. female.  Patient is a 24 year old female with a history of menorrhagia and iron deficiency anemia who presents to the ED for lower abdominal discomfort and vaginal discharge for the past 4 days.  Notes she has had some lower abdominal cramping for the past 4 days over the suprapubic area.  States this feels different than her normal menstrual cycles.  Last menstrual cycle was approximately 2 weeks ago.  She also notes she has had some increased discharge and malodor for the past 2 days.  Has not noticed any different color to the discharge.  States she did see OB a few days ago for an ultrasound for abnormal menstrual cycles and this was normal.  She is not currently on any medications for her menstrual cycles.  States she is sexually active with 1 partner and denies concerns for STIs but would like to be checked.  Denies fevers, chills, headache, nausea/vomiting/diarrhea, dysuria, hematuria, vaginal bleeding/spotting.  No other complaints.    Abdominal Pain Associated symptoms: vaginal discharge   Associated symptoms: no chest pain, no chills, no dysuria, no fever, no shortness of breath and no vaginal bleeding        Prior to Admission medications  Medication Sig Start Date End Date Taking? Authorizing Provider  cephALEXin  (KEFLEX ) 500 MG capsule Take 1 capsule (500 mg total) by mouth 2 (two) times daily for 7 days. 05/10/24 05/17/24 Yes Pranathi Winfree, Thersia RAMAN, PA-C  triamcinolone  cream (KENALOG ) 0.1 % Apply 1 Application topically 2 (two) times daily. 01/18/24   Lucius Krabbe, NP    Allergies: Patient has no known allergies.    Review of Systems  Constitutional:  Negative for chills and fever.  Respiratory:  Negative for shortness of breath.   Cardiovascular:  Negative for chest pain.   Gastrointestinal:  Positive for abdominal pain.  Genitourinary:  Positive for vaginal discharge. Negative for dysuria, vaginal bleeding and vaginal pain.  Neurological:  Negative for headaches.  All other systems reviewed and are negative.   Updated Vital Signs BP 120/82   Pulse 71   Temp 98.4 F (36.9 C) (Oral)   Resp 16   LMP 04/28/2024 (Exact Date)   SpO2 100%   Physical Exam Constitutional:      Appearance: She is well-developed.  HENT:     Head: Normocephalic and atraumatic.  Cardiovascular:     Rate and Rhythm: Normal rate.  Abdominal:     General: Bowel sounds are normal.     Tenderness: There is abdominal tenderness in the suprapubic area. Negative signs include McBurney's sign.  Skin:    General: Skin is warm and dry.  Neurological:     Mental Status: She is alert and oriented to person, place, and time.  Psychiatric:        Mood and Affect: Mood normal.        Behavior: Behavior normal.     (all labs ordered are listed, but only abnormal results are displayed) Labs Reviewed  WET PREP, GENITAL - Abnormal; Notable for the following components:      Result Value   WBC, Wet Prep HPF POC >=10 (*)    All other components within normal limits  CBC WITH DIFFERENTIAL/PLATELET - Abnormal; Notable for the following components:   Hemoglobin 11.0 (*)  HCT 34.6 (*)    All other components within normal limits  URINALYSIS, ROUTINE W REFLEX MICROSCOPIC - Abnormal; Notable for the following components:   APPearance HAZY (*)    Leukocytes,Ua MODERATE (*)    All other components within normal limits  URINALYSIS, MICROSCOPIC (REFLEX) - Abnormal; Notable for the following components:   Bacteria, UA FEW (*)    All other components within normal limits  COMPREHENSIVE METABOLIC PANEL WITH GFR  LIPASE, BLOOD  PREGNANCY, URINE  GC/CHLAMYDIA PROBE AMP ([place]) NOT AT [facility]    EKG: None  Radiology: US  PELVIC COMPLETE W TRANSVAGINAL AND TORSION R/O Result Date:  05/10/2024 CLINICAL DATA:  Suprapubic pain EXAM: TRANSABDOMINAL AND TRANSVAGINAL ULTRASOUND OF PELVIS DOPPLER ULTRASOUND OF OVARIES TECHNIQUE: Both transabdominal and transvaginal ultrasound examinations of the pelvis were performed. Transabdominal technique was performed for global imaging of the pelvis including uterus, ovaries, adnexal regions, and pelvic cul-de-sac. It was necessary to proceed with endovaginal exam following the transabdominal exam to visualize the uterus endometrium adnexa. Color and duplex Doppler ultrasound was utilized to evaluate blood flow to the ovaries. COMPARISON:  Ultrasound 05/05/2024 FINDINGS: Uterus Measurements: 7.6 x 3.9 x 5.2 cm = volume: 79 mL. No fibroids or other mass visualized. Endometrium Thickness: 7.9 mm.  No focal abnormality visualized. Right ovary Measurements: 2.8 x 3.1 x 2.3 cm = volume:  10.7 mL. Normal appearance/no adnexal mass. Doppler: There is normal vascularity on color doppler examination. Spectral doppler arterial and venous waveforms are normal. Left ovary Measurements: 3 x 1.8 x 2.4 cm = volume: 6.7 mL. Normal appearance/no adnexal mass. Doppler: There is normal vascularity on color doppler examination. Spectral doppler arterial and venous waveforms are normal. Other findings Small volume free fluid. IMPRESSION: 1. Negative for ovarian torsion. 2. Small volume free fluid. Electronically Signed   By: Luke Bun M.D.   On: 05/10/2024 16:07      Medications Ordered in the ED  ketorolac  (TORADOL ) 15 MG/ML injection 15 mg (15 mg Intramuscular Given 05/10/24 1636)                                   Medical Decision Making Patient is a 24 year old female who presents to the ED for lower abdominal pain as well as vaginal discharge for the past 3 to 4 days.  Please see detailed HPI below.  On exam patient is alert and in no acute distress.  Physical exam as noted above.  She does have mild suprapubic tenderness but no right or left lower quadrant  tenderness.  Differential includes UTI, pyelonephritis, BV, PID, PID, tubo-ovarian abscess.  Lab workup reassuring including no leukocytosis or electrolyte abnormalities.  Ultrasound of the pelvis obtained to rule out torsion or abscess, no acute abnormalities appreciated today.  Urine does show signs for mild UTI.  Wet prep is negative for BV.  I did offer pelvic exam the patient but she declines as she notes that she recently saw her gynecologist and does not want another one at this time.  Suspect patient suprapubic pain could be secondary to UTI.  She does report discharge but less concerns for PID as tenderness was very mild.  Stable for discharge home at this time.  Prescribe Keflex .  Symptomatic care discussed.  Advised STI testing will be on MyChart in approximately 48 hours.  Advised to continue following up with gynecology for further evaluation and management of menstrual cycles.  She understands plan  and is agreeable.  Return precautions provided.  Amount and/or Complexity of Data Reviewed Labs: ordered. Radiology: ordered.  Risk Prescription drug management.       Final diagnoses:  Urinary tract infection without hematuria, site unspecified  Suprapubic pain    ED Discharge Orders          Ordered    cephALEXin  (KEFLEX ) 500 MG capsule  2 times daily        05/10/24 1642               Neysa Thersia RAMAN, PA-C 05/10/24 1700    Kingsley, Victoria K, DO 05/16/24 0800  "

## 2024-05-10 NOTE — ED Triage Notes (Signed)
 Lower abd pain, abnormal vaginal discharge for 4 days  Denies urinary symptoms, NVD

## 2024-05-10 NOTE — ED Notes (Signed)
 Patient transported to Ultrasound

## 2024-05-11 LAB — GC/CHLAMYDIA PROBE AMP (~~LOC~~) NOT AT ARMC
Chlamydia: NEGATIVE
Comment: NEGATIVE
Comment: NORMAL
Neisseria Gonorrhea: NEGATIVE

## 2024-05-24 ENCOUNTER — Ambulatory Visit: Payer: Self-pay | Admitting: Obstetrics and Gynecology

## 2025-01-19 ENCOUNTER — Encounter: Admitting: Family
